# Patient Record
Sex: Male | Born: 1965
Health system: Southern US, Community
[De-identification: ages and names within clinical notes are randomized; demographics above are authoritative.]

## PROBLEM LIST (undated history)

## (undated) ENCOUNTER — Emergency Department (HOSPITAL_COMMUNITY)

## (undated) DIAGNOSIS — I499 Cardiac arrhythmia, unspecified: Secondary | ICD-10-CM

## (undated) DIAGNOSIS — J45909 Unspecified asthma, uncomplicated: Secondary | ICD-10-CM

## (undated) DIAGNOSIS — W3301XA Accidental discharge of shotgun, initial encounter: Secondary | ICD-10-CM

## (undated) DIAGNOSIS — I10 Essential (primary) hypertension: Secondary | ICD-10-CM

## (undated) DIAGNOSIS — E119 Type 2 diabetes mellitus without complications: Secondary | ICD-10-CM

## (undated) DIAGNOSIS — K219 Gastro-esophageal reflux disease without esophagitis: Secondary | ICD-10-CM

## (undated) DIAGNOSIS — I480 Paroxysmal atrial fibrillation: Secondary | ICD-10-CM

## (undated) DIAGNOSIS — E785 Hyperlipidemia, unspecified: Secondary | ICD-10-CM

## (undated) HISTORY — DX: Unspecified asthma, uncomplicated: J45.909

## (undated) HISTORY — DX: Essential (primary) hypertension: I10

## (undated) HISTORY — DX: Accidental discharge of shotgun, initial encounter: W33.01XA

## (undated) HISTORY — DX: Type 2 diabetes mellitus without complications: E11.9

## (undated) HISTORY — PX: APPENDECTOMY: SHX54

## (undated) HISTORY — DX: Cardiac arrhythmia, unspecified: I49.9

## (undated) HISTORY — DX: Gastro-esophageal reflux disease without esophagitis: K21.9

## (undated) HISTORY — DX: Hyperlipidemia, unspecified: E78.5

## (undated) HISTORY — PX: HERNIA REPAIR: SHX51

## (undated) HISTORY — DX: Paroxysmal atrial fibrillation: I48.0

---

## 2011-01-11 ENCOUNTER — Emergency Department (HOSPITAL_BASED_OUTPATIENT_CLINIC_OR_DEPARTMENT_OTHER)
Admission: EM | Admit: 2011-01-11 | Discharge: 2011-01-11 | Disposition: A | Discharge status: Nursing Home (Includes ICF) | Payer: Medicaid Other | Source: Home / Self Care | Attending: Emergency Medicine | Admitting: Emergency Medicine

## 2011-01-11 ENCOUNTER — Emergency Department (INDEPENDENT_AMBULATORY_CARE_PROVIDER_SITE_OTHER): Payer: Medicaid Other

## 2011-01-11 ENCOUNTER — Inpatient Hospital Stay (HOSPITAL_COMMUNITY)
Admission: AD | Admit: 2011-01-11 | Discharge: 2011-01-12 | DRG: 340 | Disposition: A | Discharge status: Home / Self Care | Payer: Medicaid Other | Source: Other Acute Inpatient Hospital | Attending: Surgery | Admitting: Surgery

## 2011-01-11 ENCOUNTER — Other Ambulatory Visit: Payer: Self-pay | Admitting: Surgery

## 2011-01-11 DIAGNOSIS — E785 Hyperlipidemia, unspecified: Secondary | ICD-10-CM | POA: Insufficient documentation

## 2011-01-11 DIAGNOSIS — I10 Essential (primary) hypertension: Secondary | ICD-10-CM | POA: Diagnosis present

## 2011-01-11 DIAGNOSIS — K352 Acute appendicitis with generalized peritonitis, without abscess: Principal | ICD-10-CM | POA: Diagnosis present

## 2011-01-11 DIAGNOSIS — K35209 Acute appendicitis with generalized peritonitis, without abscess, unspecified as to perforation: Principal | ICD-10-CM | POA: Diagnosis present

## 2011-01-11 DIAGNOSIS — R109 Unspecified abdominal pain: Secondary | ICD-10-CM | POA: Insufficient documentation

## 2011-01-11 DIAGNOSIS — F17200 Nicotine dependence, unspecified, uncomplicated: Secondary | ICD-10-CM | POA: Insufficient documentation

## 2011-01-11 DIAGNOSIS — K37 Unspecified appendicitis: Secondary | ICD-10-CM | POA: Insufficient documentation

## 2011-01-11 DIAGNOSIS — Q619 Cystic kidney disease, unspecified: Secondary | ICD-10-CM | POA: Insufficient documentation

## 2011-01-11 LAB — MRSA PCR SCREENING: MRSA by PCR: NEGATIVE

## 2011-01-11 LAB — DIFFERENTIAL
Eosinophils Relative: 1 % (ref 0–5)
Lymphocytes Relative: 29 % (ref 12–46)
Lymphs Abs: 2.8 10*3/uL (ref 0.7–4.0)
Monocytes Absolute: 0.8 10*3/uL (ref 0.1–1.0)

## 2011-01-11 LAB — COMPREHENSIVE METABOLIC PANEL
ALT: 25 U/L (ref 0–53)
AST: 24 U/L (ref 0–37)
Calcium: 9.5 mg/dL (ref 8.4–10.5)
GFR calc Af Amer: >60 mL/min (ref >60)
Sodium: 142 mEq/L (ref 135–145)
Total Protein: 8.3 g/dL (ref 6.0–8.3)

## 2011-01-11 LAB — URINALYSIS, ROUTINE W REFLEX MICROSCOPIC
Bilirubin Urine: NEGATIVE
Glucose, UA: NEGATIVE mg/dL
Specific Gravity, Urine: 1.026 (ref 1.005–1.030)
Urobilinogen, UA: 1 mg/dL (ref 0.0–1.0)

## 2011-01-11 LAB — CBC
HCT: 42.3 % (ref 39.0–52.0)
Hemoglobin: 14.9 g/dL (ref 13.0–17.0)
MCV: 81.3 fL (ref 78.0–100.0)
RDW: 13.1 % (ref 11.5–15.5)
WBC: 9.8 10*3/uL (ref 4.0–10.5)

## 2011-01-11 LAB — URINE MICROSCOPIC-ADD ON

## 2011-01-11 LAB — LIPASE, BLOOD: Lipase: 77 U/L (ref 23–300)

## 2011-01-11 MED ORDER — IOHEXOL 300 MG/ML  SOLN
100.0000 mL | Freq: Once | INTRAMUSCULAR | Status: AC | PRN
  Administered 2011-01-11: 100 mL via INTRAVENOUS

## 2011-01-12 LAB — URINE CULTURE
Colony Count: NO GROWTH
Colony Count: NO GROWTH
Culture  Setup Time: 201203121948
Culture: NO GROWTH

## 2011-01-18 NOTE — Op Note (Signed)
NAMELAWERENCE, Roger Fisher                ACCOUNT NO.:  [id number]  MEDICAL RECORD NO.:  [id number]           PATIENT TYPE:  I  LOCATION:  5121                         FACILITY:  [facility]  PHYSICIAN:  Sandria Bales. Ezzard Standing, M.D.  DATE OF BIRTH:  [birth year]  DATE OF PROCEDURE: 11 January 2011                              OPERATIVE REPORT  PREOPERATIVE DIAGNOSIS:  Appendicitis.  POSTOPERATIVE DIAGNOSIS:  Focal ruptured appendicitis.  PROCEDURE:  Laparoscopic appendectomy.  SURGEON:  Sandria Bales. Ezzard Standing, MD  FIRST ASSISTANT:  Eber Hong, P.A.  ANESTHESIA:  General endotracheal.  ESTIMATED BLOOD LOSS:  Minimal.  INDICATIONS FOR PROCEDURE:  Roger Fisher is a 45 year old black male who sees Dr. Julio Sicks as his primary medical doctor, came through the [facility] ER with abdominal pain, localized to the right lower quadrant.  CT scan suggested appendicitis.  I discussed with the patient indications, potential complications of appendectomy.  Potential complications include, but not limited to, bleeding, infection which I think he already has, the possibility of open surgery and the possibility of another diagnosis.  OPERATIVE NOTE:  The patient placed in a supine position with his left arm tucked to his side, Foley catheter in place.  His abdomen was prepped with ChloraPrep and sterilely draped.  A time-out was held and the surgical checklist run.  He was given 1 g of Cefoxitin initially in procedure.  He had a prior smiling infraumbilical incision.  I used the same incision to get into the abdominal cavity.  I placed a 12 mm Hasson trocar inferior to the umbilicus and secured with a 0 Vicryl suture.  I placed a 5 mm trocar in the right upper quadrant and an 11 mm trocar in the left lower quadrant and then did abdominal exploration.  Left lobe of liver was unremarkable.  Stomach was unremarkable.  The bowel that I could see was unremarkable.    In his right mid abdomen to right lower  quadrant, he had inflammatory mass lateral to the cecum.  The antimesenteric fold of Roger Fisher was taken down of the inflamed appendix.  The appendix itself was curled back on itself, so I mobilized the appendix up.  I took the mesentery of the appendix down to the base of the appendix, used a blue load of the 45 mm Ethicon Endo GI stapler and fired this across the base of the appendix.  The appendix was then placed in EndoCatch bag and delivered through the umbilicus.  The appendiceal stump was then revisualized.  The staple line looked good.  There was no evidence of any leak or bleeding.  I irrigated the abdomen with about 600 cc of saline.  Again, his appendix was focally perforated in this kind of right lower quadrant area, but there was no residual purulence at the end of the case.  I then closed the umbilical port with a 0 Vicryl suture.  The skin at each port was closed with a 5-0 Vicryl suture painted with Dermabond and sterilely dressed.  The patient tolerated the procedure well, was transported to the recovery room in good condition.  Sponge and needle counts  were correct at the end of the case.   Sandria Bales. Ezzard Standing, M.D., FACS   DHN/MEDQ  D:  01/11/2011  T:  01/11/2011  Job:  [id number]  cc:   [name], M.D. Fax: [phone]  Electronically Signed by Ovidio Kin M.D. on 01/18/2011 12:35:35 PM

## 2011-01-22 NOTE — Discharge Summary (Signed)
NAMENICHALAS, COIN                ACCOUNT NO.:  [id number]  MEDICAL RECORD NO.:  [id number]           PATIENT TYPE:  I  LOCATION:  5121                         FACILITY:  [facility]  PHYSICIAN:  Sandria Bales. Ezzard Standing, M.D.  DATE OF BIRTH:  1965/12/13  DATE OF ADMISSION:  01/11/2011 DATE OF DISCHARGE:  01/12/2011                              DISCHARGE SUMMARY   ADMISSION DIAGNOSES: 1. Appendicitis. 2. Hypertension. 3. Dyslipidemia.  DISCHARGE DIAGNOSES: 1. Appendicitis. 2. Hypertension. 3. Dyslipidemia.  PROCEDURES:  Laparoscopic appendectomy January 11, 2011, Dr. Ezzard Standing.  BRIEF HISTORY:  The patient is a 45 year old African American gentleman who developed pain about 2 days ago prior to being seen at [facility] Emergency Department.  Labs and CAT scan at that time showed no acute findings, and he was sent home with instructions to follow up with primary care.  He states the pain went from general abdominal area of the right lower quadrant, and he was seen in the [facility] division on the day of admission.  Repeat labs and CT scan at that time showed a periappendiceal inflammatory changes in the right lower quadrant both consistent with probable appendicitis.  He was subsequently transferred to [facility] for evaluation and treatment.  PAST MEDICAL HISTORY:  Hypertension and dyslipidemia.  PAST SURGICAL HISTORY:  Ventral hernia repair as an infant.  He also had bilateral inguinal hernias most recently 3 years ago in [place].  CURRENT MEDICATIONS:  Pravastatin 20 mg daily and losartan 50 mg daily.  ALLERGIES:  SHELLFISH, otherwise no known drugs or latex allergies.  HOSPITAL COURSE:  The patient was admitted and seen by Dr. Ezzard Standing.  It was his opinion the patient will be best served by going to Surgery. The risks and benefits were discussed.  He was transferred to the OR from the floor.  He was started on IV antibiotics.  In the OR,  he underwent procedures described above.  He tolerated without any problems.  The patient had a focal rupture of his appendix.  It was removed without difficulty, and he was transferred to the floor.  He was mobilized.  On first postoperative morning, he was eating breakfast, tolerating a full diet without any difficulty.  He was ambulating without difficulty.  We plan to keep him through lunchtime.  If he tolerates lunch well, to allow him to be discharged home.    Because of his focally ruptured appendicitis, he was maintained on antibiotics and switched from cefoxitin to Augmentin, and we plan to keep him on Augmentin for a total of 7 days.  The patient was instructed to call us if he has any problems postoperatively that includes fever, general malaise, or recurrent abdominal pain.  He has a full instruction sheet with that.  WOUND CARE:  He has Steri-Strips.  He was instructed to shower.  No bathing.  He can remove the Steri-Strips in 5-7 days.  He will return for followup on January 26, 2011, at 2:15.  He can return to work at [employer] duty on February 01, 2011, and return to full duty on February 05, 2011.  DISCHARGE MEDICATIONS:  He can continue his losartan 50 mg p.o. daily and pravastatin 20 mg daily.  He can continue to take Tylenol 650 mg q.4 p.r.n. for pain or ibuprofen p.r.n. for pain.  He is given a prescription for Augmentin 875/125 one tablet p.o. q.12 for 7 days, and hydrocodone/APAP 1-2 p.o. q.4 h. p.r.n.  His followup appointment is on January 26, 2011, at 2:15 p.m.  CONDITION ON DISCHARGE:  Improved.   Eber Hong, P.A.   Sandria Bales. Ezzard Standing, M.D., FACS   WDJ/MEDQ  D:  01/12/2011  T:  01/13/2011  Job:  [id number]  cc:   [name], M.D.  Electronically Signed by Sherrie George P.A. on 01/19/2011 04:42:41 PM Electronically Signed by Ovidio Kin M.D. on 01/22/2011 07:35:23 AM

## 2011-02-11 NOTE — H&P (Signed)
Roger Fisher, Roger Fisher                ACCOUNT NO.:  [id number]  MEDICAL RECORD NO.:  [id number]           PATIENT TYPE:  I  LOCATION:  5121                         FACILITY:  [facility]  PHYSICIAN:  Sandria Bales. Ezzard Standing, M.D.  DATE OF BIRTH:  February 21, 1966  DATE OF ADMISSION:  01/11/2011                             HISTORY & PHYSICAL   CHIEF COMPLAINT:  Abdominal pain.  HISTORY OF PRESENT ILLNESS:  Roger Fisher is a pleasant 45 year old African American gentleman who complained of abdominal pain about 2 days ago.  This prompted him to go to [facility] Emergency Department where he was seen and evaluated there.  Apparently some labs and CAT scan were done there, essentially showing no acute findings. The patient was sent home from the emergency department with instructions to followup with primary care physician if the pain did not improve.  He states pain subsequently moved from his general abdomen to the right lower quadrant, has started becoming accompanied by nausea and vomiting as well as fever, chills, and sweats.    He saw his primary care provider this morning, who evaluated the patient and subsequently sent him back to the [facility] for followup evaluation.  He was seen there, had a repeat set of labs, and a repeat CT scan was performed.  At this time, there appeared to the periappendiceal inflammatory changes in the right lower quadrant, possibly consistent with appendicitis.  He has subsequently been sent to our facility for definitive treatment.  The patient denies any chest pain, shortness of breath accompanied by this.  He denies any prior abdominal or gastrointestinal history since his Crohn's, ulcer disease, gallbladder disease, or irritable bowel.  He denies any dysuria, hematuria.  He denies any skin rashes.  He denies any ill contacts or recent travel history.  PAST MEDICAL HISTORY:  Significant for hypertension and hyperlipidemia.  PAST  SURGICAL HISTORY:  The patient had a ventral hernia repair as an infant.  He has also had bilateral inguinal hernia repair most recently approximately 3 years ago.  However, he was living in [place] at that time.  FAMILY HISTORY:  Noncontributory to the present case.  SOCIAL HISTORY:  The patient is married.  He is employed in Orthoptist at [employer].  He does smoke approximately pack of cigarettes a day, has an occasional alcoholic beverage and denies any use of illicit drugs.  MEDICATIONS:  Lipitor and a blood pressure medicine he cannot recall  ALLERGIES:  SHELLFISH; otherwise, no known drug or latex allergies.  REVIEW OF SYSTEMS:  Please see history of present illness for pertinent findings.  PHYSICAL EXAMINATION:  GENERAL:  A 45 year old gentleman who does not appear in any acute distress. VITAL SIGNS:  Currently being obtained. ENT:  Unremarkable. NECK:  Supple without lymphadenopathy.  Trachea is midline.  No thyromegaly or masses. LUNGS:  Clear to auscultation.  No wheezes, rhonchi, or rales.  Normal respiratory effort without use of accessory muscles. HEART:  Regular rate and rhythm.  No murmurs, gallops, or rubs. Carotids are 2+ and brisk without bruits.  Peripheral pulses intact and symmetrical.  ABDOMEN:  Soft and nondistended.  No mass effect or hernias are appreciated.  Surgical scars noted, correlate with prior history.  The patient is quite tender in the right lower quadrant with positive guarding. RECTAL:  Deferred. GENITOURINARY:  Deferred. EXTREMITIES:  Good active range of motion in all extremities without crepitus or pain.  Normal muscle strength and tone without atrophy. SKIN:  Otherwise warm and dry with good turgor.  No rashes, lesions, or nodules. NEUROLOGIC:  The patient is alert and oriented x3.  Cranial nerves II- XII grossly intact.  DIAGNOSTICS:  CBC shows a white blood cell count of 9.8, hemoglobin of 14.9, hematocrit of 42.3,  platelet count of 232.  Metabolic panel, sodium of 142, potassium at 3.7, chloride of 104, CO2 of 26, BUN of 8, creatinine of 1.0, glucose of 100.  Liver enzymes including lipase within normal limits.  Urinalysis shows evidence of leukocytosis and bacteriuria.  IMAGING:  CT scan of the abdomen and pelvis shows inflammatory changes in the right lower quadrant with stranding in the periappendiceal fat consistent with early appendicitis.  No free air.  No evidence of abscess formation are noted.  Incidental finding of a left kidney cyst.  IMPRESSION: 1. Acute appendicitis. 2. Hypertension.  PLAN:  We will admit the patient, begin IV antibiotics, and prep for operative resection of the appendix.  I have discussed the procedure of laparoscopic appendectomy including the potential risks, complications, and postoperative expectations with the patient, who agrees to consent.   [name], PA-C   Sandria Bales. Ezzard Standing, M.D., FACS  KB/MEDQ  D:  01/11/2011  T:  01/12/2011  Job:  [id number]  Electronically Signed by [name]  on 01/27/2011 01:52:01 PM Electronically Signed by Ovidio Kin M.D. on 02/11/2011 07:46:12 AM

## 2016-02-11 DIAGNOSIS — N529 Male erectile dysfunction, unspecified: Secondary | ICD-10-CM | POA: Insufficient documentation

## 2016-04-28 DIAGNOSIS — K859 Acute pancreatitis without necrosis or infection, unspecified: Secondary | ICD-10-CM | POA: Insufficient documentation

## 2016-04-28 DIAGNOSIS — F17210 Nicotine dependence, cigarettes, uncomplicated: Secondary | ICD-10-CM | POA: Insufficient documentation

## 2016-06-03 DIAGNOSIS — H40053 Ocular hypertension, bilateral: Secondary | ICD-10-CM | POA: Insufficient documentation

## 2016-06-03 DIAGNOSIS — E1165 Type 2 diabetes mellitus with hyperglycemia: Secondary | ICD-10-CM | POA: Insufficient documentation

## 2016-06-03 DIAGNOSIS — H524 Presbyopia: Secondary | ICD-10-CM | POA: Insufficient documentation

## 2017-01-18 DIAGNOSIS — M6282 Rhabdomyolysis: Secondary | ICD-10-CM | POA: Insufficient documentation

## 2017-02-01 DIAGNOSIS — Z6833 Body mass index (BMI) 33.0-33.9, adult: Secondary | ICD-10-CM | POA: Insufficient documentation

## 2017-05-02 DIAGNOSIS — E1149 Type 2 diabetes mellitus with other diabetic neurological complication: Secondary | ICD-10-CM

## 2017-05-02 DIAGNOSIS — E1140 Type 2 diabetes mellitus with diabetic neuropathy, unspecified: Secondary | ICD-10-CM | POA: Insufficient documentation

## 2018-04-07 ENCOUNTER — Encounter: Payer: Self-pay | Admitting: Family Medicine

## 2018-04-07 ENCOUNTER — Ambulatory Visit: Payer: BLUE CROSS/BLUE SHIELD | Admitting: Family Medicine

## 2018-04-07 VITALS — BP 150/100 | HR 85 | Ht 70.0 in | Wt 213.4 lb

## 2018-04-07 DIAGNOSIS — E119 Type 2 diabetes mellitus without complications: Secondary | ICD-10-CM

## 2018-04-07 DIAGNOSIS — I480 Paroxysmal atrial fibrillation: Secondary | ICD-10-CM | POA: Diagnosis not present

## 2018-04-07 DIAGNOSIS — E1140 Type 2 diabetes mellitus with diabetic neuropathy, unspecified: Secondary | ICD-10-CM | POA: Insufficient documentation

## 2018-04-07 DIAGNOSIS — N529 Male erectile dysfunction, unspecified: Secondary | ICD-10-CM | POA: Diagnosis not present

## 2018-04-07 DIAGNOSIS — I10 Essential (primary) hypertension: Secondary | ICD-10-CM | POA: Diagnosis not present

## 2018-04-07 MED ORDER — CHLORTHALIDONE 25 MG PO TABS: 25.0000 mg | ORAL_TABLET | Freq: Every day | ORAL | 1 refills | Status: DC

## 2018-04-07 MED ORDER — METFORMIN HCL ER 500 MG PO TB24: 1000.0000 mg | ORAL_TABLET | Freq: Every day | ORAL | 1 refills | Status: DC

## 2018-04-07 MED ORDER — METOPROLOL SUCCINATE ER 25 MG PO TB24: 25.0000 mg | ORAL_TABLET | Freq: Every day | ORAL | 1 refills | Status: DC

## 2018-04-07 MED ORDER — SILDENAFIL CITRATE 20 MG PO TABS: ORAL_TABLET | ORAL | 1 refills | Status: DC

## 2018-04-07 MED ORDER — EMPAGLIFLOZIN 25 MG PO TABS: 25.0000 mg | ORAL_TABLET | Freq: Every day | ORAL | 6 refills | Status: DC

## 2018-04-07 MED ORDER — ATORVASTATIN CALCIUM 20 MG PO TABS: 20.0000 mg | ORAL_TABLET | Freq: Every day | ORAL | 1 refills | Status: DC

## 2018-04-07 NOTE — Patient Instructions (Signed)
Restart medications listed on current med list Follow up with me in 6 weeks.    Atrial Fibrillation Atrial fibrillation is a type of heartbeat that is irregular or fast (rapid). If you have this condition, your heart keeps quivering in a weird (chaotic) way. This condition can make it so your heart cannot pump blood normally. Having this condition gives a person more risk for stroke, heart failure, and other heart problems. There are different types of atrial fibrillation. Talk with your doctor to learn about the type that you have. Follow these instructions at home:  Take over-the-counter and prescription medicines only as told by your doctor.  If your doctor prescribed a blood-thinning medicine, take it exactly as told. Taking too much of it can cause bleeding. If you do not take enough of it, you will not have the protection that you need against stroke and other problems.  Do not use any tobacco products. These include cigarettes, chewing tobacco, and e-cigarettes. If you need help quitting, ask your doctor.  If you have apnea (obstructive sleep apnea), manage it as told by your doctor.  Do not drink alcohol.  Do not drink beverages that have caffeine. These include coffee, soda, and tea.  Maintain a healthy weight. Do not use diet pills unless your doctor says they are safe for you. Diet pills may make heart problems worse.  Follow diet instructions as told by your doctor.  Exercise regularly as told by your doctor.  Keep all follow-up visits as told by your doctor. This is important. Contact a doctor if:  You notice a change in the speed, rhythm, or strength of your heartbeat.  You are taking a blood-thinning medicine and you notice more bruising.  You get tired more easily when you move or exercise. Get help right away if:  You have pain in your chest or your belly (abdomen).  You have sweating or weakness.  You feel sick to your stomach (nauseous).  You notice blood  in your throw up (vomit), poop (stool), or pee (urine).  You are short of breath.  You suddenly have swollen feet and ankles.  You feel dizzy.  Your suddenly get weak or numb in your face, arms, or legs, especially if it happens on one side of your body.  You have trouble talking, trouble understanding, or both.  Your face or your eyelid droops on one side. These symptoms may be an emergency. Do not wait to see if the symptoms will go away. Get medical help right away. Call your local emergency services (911 in the [place] Do not drive yourself to the hospital. This information is not intended to replace advice given to you by your health care provider. Make sure you discuss any questions you have with your health care provider. Document Released: 07/27/2008 Document Revised: 03/25/2016 Document Reviewed: 02/12/2015 Elsevier Interactive Patient Education  [employer]2018 Elsevier Inc.

## 2018-04-07 NOTE — Assessment & Plan Note (Signed)
Poorly controlled Restart metformin and jardiance Monitor glucose at home Update a1c and microalbumin today.

## 2018-04-07 NOTE — Addendum Note (Signed)
Addended by: Arva ChafeGARCIA, Gawain Crombie M on: 04/07/2018 04:07 PM   Modules accepted: Orders

## 2018-04-07 NOTE — Assessment & Plan Note (Signed)
He has done well with sildenafil previously, renewed.

## 2018-04-07 NOTE — Progress Notes (Signed)
Roger LarsenMark Fisher - 52 y.o. male MRN [id number]  Date of birth: 09/25/1966  Subjective Chief Complaint  Patient presents with  . Atrial Fibrillation  . Diabetes  . Hypertension    HPI Roger LarsenMark Fisher is a 52 y.o.  male known to me from previous practice here today to establish care and follow up of chronic medical conditions.  He was seeing another provider after I left previous practice however did not agree with treatment and quit going.  Hasn't been seen in >1 year.  Diagnosed with A. Fib last year and saw Cardiology x1. He is not currently taking any medications.    -HTN:  Prior treatment with chlorthalidone and metoprolol added after diagnosis of a. Fib.  Not currently taking either one.  BP elevated today, does not check BP at home.  Has been more active due to job and has lost some weight.  Denies high salt diet.   -A. Fib:  Diagnosed with a. Fib last year, started on amiodarone and toprol.  He was anticoagulated with eliquis for CHA2DS2-VASc score of 2.  Denies side effects from medications.  Seen by cardiology x1, unclear if any further work up was completed.  He denies any symptoms at this time but felt like his heart was out of rhythm the other night, which lasted for several minutes.  He denies anginal symptoms, sob, dizziness, increased fatigue or edema.  -T2DM:  History of poorly controlled diabetes.  Prior tx with jardiance and metformin.  Metformin had been increased to 1000mg  bid however he did not tolerate this well.  He has not monitored blood sugars at home.  He does report polyuria but denies polydipsia.  Reports good sensation in extremities without neuropathic symptoms.    -HLD:  Has done well with atorvastatin previously. Denies myalgias with this.   ROS:  ROS completed and negative except as noted per HPI No Known Allergies  Past Medical History:  Diagnosis Date  . Arrhythmia   . Diabetes mellitus without complication (HCC)    type 2  . Hypertension     Past Surgical  History:  Procedure Laterality Date  . APPENDECTOMY    . HERNIA REPAIR      Social History   Socioeconomic History  . Marital status: Married    Spouse name: Not on file  . Number of children: Not on file  . Years of education: Not on file  . Highest education level: Not on file  Occupational History  . Not on file  Social Needs  . Financial resource strain: Not on file  . Food insecurity:    Worry: Not on file    Inability: Not on file  . Transportation needs:    Medical: Not on file    Non-medical: Not on file  Tobacco Use  . Smoking status: Former Games developermoker  . Smokeless tobacco: Never Used  Substance and Sexual Activity  . Alcohol use: Not Currently    Frequency: Never  . Drug use: Never  . Sexual activity: Not on file  Lifestyle  . Physical activity:    Days per week: Not on file    Minutes per session: Not on file  . Stress: Not on file  Relationships  . Social connections:    Talks on phone: Not on file    Gets together: Not on file    Attends religious service: Not on file    Active member of club or organization: Not on file    Attends meetings of clubs  or organizations: Not on file    Relationship status: Not on file  Other Topics Concern  . Not on file  Social History Narrative  . Not on file    History reviewed. No pertinent family history.  Health Maintenance  Topic Date Due  . HEMOGLOBIN A1C  Apr 01, 1967  . PNEUMOCOCCAL POLYSACCHARIDE VACCINE (1) 02/06/1969  . FOOT EXAM  02/06/1977  . OPHTHALMOLOGY EXAM  02/06/1977  . HIV Screening  02/06/1982  . TETANUS/TDAP  02/06/1986  . COLONOSCOPY  02/06/2017  . INFLUENZA VACCINE  06/01/2018    ----------------------------------------------------------------------------------------------------------------------------------------------------------------------------------------------------------------- Physical Exam BP (!) 150/100   Pulse 85   Ht 5\' 10"  (1.778 m)   Wt 213 lb 6.4 oz (96.8 kg)   BMI  30.62 kg/m   Physical Exam  Constitutional: He is oriented to person, place, and time. He appears well-nourished. No distress.  HENT:  Head: Normocephalic and atraumatic.  Mouth/Throat: Oropharynx is clear and moist.  Eyes: No scleral icterus.  Neck: Neck supple. No thyromegaly present.  Cardiovascular: Normal rate, regular rhythm and normal heart sounds.  No murmur heard. Pulses:      Dorsalis pedis pulses are 2+ on the right side, and 2+ on the left side.       Posterior tibial pulses are 2+ on the right side, and 2+ on the left side.  Pulmonary/Chest: Effort normal and breath sounds normal.  Musculoskeletal: He exhibits no edema.       Right foot: There is normal range of motion and no deformity.       Left foot: There is normal range of motion and no deformity.  Feet:  Right Foot:  Protective Sensation: 4 sites tested. 4 sites sensed.  Skin Integrity: Negative for ulcer, blister, skin breakdown, erythema, warmth, callus or dry skin.  Left Foot:  Protective Sensation: 4 sites tested. 4 sites sensed.  Skin Integrity: Negative for ulcer, blister, skin breakdown, erythema, warmth, callus or dry skin.  Neurological: He is alert and oriented to person, place, and time.  Skin: Skin is warm and dry.  Psychiatric: He has a normal mood and affect. His behavior is normal.     EKG: NSR without ST-T wave changes.  Normal PR and QT intervals.  ------------------------------------------------------------------------------------------------------------------------------------------------------------------------------------------------------------------- Assessment and Plan  Essential hypertension BP elevated Restart chlorthalidone and toprol Instructed to follow low salt diet.  Update labs  Paroxysmal atrial fibrillation (HCC) RRR on exam and NSR on EKG today Restart toprol Check renal function and CBC and plan to restart eliquis if appropriate given elevated CHA2DS2-VASc  score Referral to cardiology.   Type 2 diabetes mellitus without complication, without long-term current use of insulin (HCC) Poorly controlled Restart metformin and jardiance Monitor glucose at home Update a1c and microalbumin today.    Erectile dysfunction He has done well with sildenafil previously, renewed.

## 2018-04-07 NOTE — Assessment & Plan Note (Signed)
BP elevated Restart chlorthalidone and toprol Instructed to follow low salt diet.  Update labs

## 2018-04-07 NOTE — Assessment & Plan Note (Signed)
RRR on exam and NSR on EKG today Restart toprol Check renal function and CBC and plan to restart eliquis if appropriate given elevated CHA2DS2-VASc score Referral to cardiology.

## 2018-04-10 ENCOUNTER — Telehealth: Payer: Self-pay | Admitting: Family Medicine

## 2018-04-10 MED ORDER — SILDENAFIL CITRATE 100 MG PO TABS: 50.0000 mg | ORAL_TABLET | Freq: Every day | ORAL | 11 refills | Status: DC | PRN

## 2018-04-10 NOTE — Telephone Encounter (Signed)
Hey can you look at this and help?

## 2018-04-10 NOTE — Telephone Encounter (Signed)
 20mg  sildenafil is likely not covered by insurance- will switch to 100mg  sildenafil as this is generic now Was waiting on labs to return prior to sending in eliquis to be sure it was safe for him to restart.

## 2018-04-10 NOTE — Telephone Encounter (Signed)
Copied from CRM [id number]. Topic: Quick Communication - Rx Refill/Question >> Apr 10, 2018 11:49 AM Maia Pettiesrtiz, Kristie S wrote: Medication: Everlene BallsELIQUIS - pt states this medication was not sent to the pharmacy 04/07/18 - he said that he was hoping to get a coupon as well so he can afford the blood thinner - if no way to get it cheaper he will need something else. sildenafil - pt states pharmacy told him he cannot get it - maybe it requires authorization - pt requesting call back Has the patient contacted their pharmacy? Yes - they do not have eliquis - he isn't sure why insurance "rejected" the viagra Preferred Pharmacy (with phone number or street name): [id number] - [place], [place] - [id number] [phone] (Phone) [phone] (Fax)

## 2018-04-10 NOTE — Telephone Encounter (Signed)
Spoke with pt and informed him of rx change. Also informed him that his Eloquis would be evaluated after he had labs drawn. Pt states hes going to lab on Wed at 3pm. TLG

## 2018-04-12 ENCOUNTER — Other Ambulatory Visit (INDEPENDENT_AMBULATORY_CARE_PROVIDER_SITE_OTHER): Payer: BLUE CROSS/BLUE SHIELD

## 2018-04-12 DIAGNOSIS — I480 Paroxysmal atrial fibrillation: Secondary | ICD-10-CM

## 2018-04-12 DIAGNOSIS — I10 Essential (primary) hypertension: Secondary | ICD-10-CM

## 2018-04-12 DIAGNOSIS — E119 Type 2 diabetes mellitus without complications: Secondary | ICD-10-CM | POA: Diagnosis not present

## 2018-04-12 LAB — COMPREHENSIVE METABOLIC PANEL
ALT: 16 U/L (ref 0–53)
AST: 15 U/L (ref 0–37)
Albumin: 4.4 g/dL (ref 3.5–5.2)
Alkaline Phosphatase: 109 U/L (ref 39–117)
BILIRUBIN TOTAL: 0.5 mg/dL (ref 0.2–1.2)
BUN: 16 mg/dL (ref 6–23)
CALCIUM: 10.2 mg/dL (ref 8.4–10.5)
CHLORIDE: 92 meq/L — AB (ref 96–112)
CO2: 33 meq/L — AB (ref 19–32)
Creatinine, Ser: 0.92 mg/dL (ref 0.40–1.50)
GFR: 111.47 mL/min (ref >60.00)
GLUCOSE: 372 mg/dL — AB (ref 70–99)
Potassium: 3.1 mEq/L — ABNORMAL LOW (ref 3.5–5.1)
Sodium: 138 mEq/L (ref 135–145)
Total Protein: 7.2 g/dL (ref 6.0–8.3)

## 2018-04-12 LAB — CBC
HCT: 43 % (ref 39.0–52.0)
HEMOGLOBIN: 14.8 g/dL (ref 13.0–17.0)
MCHC: 34.5 g/dL (ref 30.0–36.0)
MCV: 84.8 fl (ref 78.0–100.0)
PLATELETS: 283 10*3/uL (ref 150.0–400.0)
RBC: 5.07 Mil/uL (ref 4.22–5.81)
RDW: 13.1 % (ref 11.5–15.5)
WBC: 6.5 10*3/uL (ref 4.0–10.5)

## 2018-04-12 LAB — TSH: TSH: 0.55 u[IU]/mL (ref 0.35–4.50)

## 2018-04-12 LAB — LDL CHOLESTEROL, DIRECT: Direct LDL: 105 mg/dL

## 2018-04-12 LAB — LIPID PANEL
CHOL/HDL RATIO: 6
CHOLESTEROL: 153 mg/dL (ref 0–200)
HDL: 25.7 mg/dL — ABNORMAL LOW (ref >39.00)
NONHDL: 127.54
TRIGLYCERIDES: 207 mg/dL — AB (ref 0.0–149.0)
VLDL: 41.4 mg/dL — AB (ref 0.0–40.0)

## 2018-04-12 LAB — MICROALBUMIN / CREATININE URINE RATIO
Creatinine,U: 97.9 mg/dL
Microalb Creat Ratio: 3.9 mg/g (ref 0.0–30.0)
Microalb, Ur: 3.8 mg/dL — ABNORMAL HIGH (ref 0.0–1.9)

## 2018-04-12 LAB — HEMOGLOBIN A1C: Hgb A1c MFr Bld: 13.5 % — ABNORMAL HIGH (ref 4.6–6.5)

## 2018-04-13 MED ORDER — POTASSIUM CHLORIDE CRYS ER 20 MEQ PO TBCR: 20.0000 meq | EXTENDED_RELEASE_TABLET | Freq: Every day | ORAL | 3 refills | Status: DC

## 2018-04-13 MED ORDER — APIXABAN 5 MG PO TABS: 5.0000 mg | ORAL_TABLET | Freq: Two times a day (BID) | ORAL | 5 refills | Status: DC

## 2018-04-13 NOTE — Progress Notes (Signed)
-  A1c is very high.  Take current metformin and jardiance.  Follow low carb diet with regular exercise.  If not seeing significant improvement at next check, will need to start insulin.  -Potassium is low, I am going to send in a prescription for this.  -Will send in rx for eliquis, we have coupon here for him.

## 2018-04-13 NOTE — Addendum Note (Signed)
Addended by: Mammie LorenzoMATTHEWS, Yuan Gann E on: 04/13/2018 11:37 AM   Modules accepted: Orders

## 2018-04-18 ENCOUNTER — Telehealth: Payer: Self-pay | Admitting: Family Medicine

## 2018-04-18 NOTE — Telephone Encounter (Unsigned)
Copied from CRM [phone]. Topic: General - Other >> Apr 18, 2018  3:10 PM Mcneil, Ja-Kwan wrote: Reason for CRM: Pt states the side effects of the medications that he recently began to take may be causing a lump in his shoulder and he would like to speak with someone to discuss. Pt request a call back. Cb# [phone]

## 2018-04-19 ENCOUNTER — Ambulatory Visit: Payer: BLUE CROSS/BLUE SHIELD | Admitting: Cardiology

## 2018-04-19 ENCOUNTER — Encounter: Payer: Self-pay | Admitting: Cardiology

## 2018-04-19 VITALS — BP 118/62 | HR 92 | Ht 70.0 in | Wt 206.1 lb

## 2018-04-19 DIAGNOSIS — R0789 Other chest pain: Secondary | ICD-10-CM | POA: Diagnosis not present

## 2018-04-19 DIAGNOSIS — E119 Type 2 diabetes mellitus without complications: Secondary | ICD-10-CM | POA: Diagnosis not present

## 2018-04-19 DIAGNOSIS — I10 Essential (primary) hypertension: Secondary | ICD-10-CM | POA: Diagnosis not present

## 2018-04-19 DIAGNOSIS — I480 Paroxysmal atrial fibrillation: Secondary | ICD-10-CM | POA: Diagnosis not present

## 2018-04-19 DIAGNOSIS — N529 Male erectile dysfunction, unspecified: Secondary | ICD-10-CM

## 2018-04-19 MED ORDER — LISINOPRIL 5 MG PO TABS: 5.0000 mg | ORAL_TABLET | Freq: Every day | ORAL | 6 refills | Status: DC

## 2018-04-19 NOTE — Patient Instructions (Signed)
Medication Instructions:  Your physician has recommended you make the following change in your medication:  STOP chlorthalidone STOP potassium  START lisinopril 5 mg daily   Labwork: Your physician recommends that you have the following labs drawn in 1 week: BMP. Please come back to our office on Thursday, 04/27/18, no appointment needed.   Testing/Procedures: You had an EKG today.   Your physician has requested that you have a stress echocardiogram. For further information please visit [email or web address]. Please follow instruction sheet as given.   Your physician has recommended that you wear a holter monitor. Holter monitors are medical devices that record the heart's electrical activity. Doctors most often use these monitors to diagnose arrhythmias. Arrhythmias are problems with the speed or rhythm of the heartbeat. The monitor is a small, portable device. You can wear one while you do your normal daily activities. This is usually used to diagnose what is causing palpitations/syncope (passing out). Wear for 7 days.   Follow-Up: Your physician recommends that you schedule a follow-up appointment in: 1 month.   If you need a refill on your cardiac medications before your next appointment, please call your pharmacy.   Thank you for choosing [facility]! Mady Gemmaatherine Burnett Lieber, RN [phone]

## 2018-04-19 NOTE — Telephone Encounter (Signed)
Please have him come in to see me for this.  Thanks!

## 2018-04-19 NOTE — Telephone Encounter (Signed)
Pt. Reports his left shoulder started hurting Sunday. He was just standing and had a sharp pain in his shoulder where his neck and shoulder meet. Sometimes there is "a lump there and then it goes away. Hurts to lift my shoulder over my head." Pt. Thinks it is due to his medications he has restarted. Wants to know Dr. Ashley RoyaltyMatthews thinks. Please advise pt.

## 2018-04-19 NOTE — Addendum Note (Signed)
Addended by: Crist FatLOCKHART, Jacere Pangborn P on: 04/19/2018 04:20 PM   Modules accepted: Orders

## 2018-04-19 NOTE — Progress Notes (Signed)
Cardiology Consultation:    Date:  04/19/2018   ID:  Roger Fisher, DOB 1966/07/21, MRN [id number]  PCP:  Everrett Coombe, DO  Cardiologist:  Gypsy Balsam, MD   Referring MD: Everrett Coombe, DO   Chief Complaint  Patient presents with  . Atrial Fibrillation  I have palpitations  History of Present Illness:    Roger Fisher is a 52 y.o. male who is being seen today for the evaluation of a fibrillation at the request of Everrett Coombe, DO.  In March he ended up going to the hospital.  He was find to be in atrial fibrillation.  From what I can gather he was evaluated with echocardiogram apparently there is some notion of some asymmetry sitting in his septum.  He converted spontaneously articulation has been initiated then he seen his primary care physician after that.  He decided to stop all his medication he did not take any medications until he find some new physician who put him on excellent medical management.  He described to have still some palpitations lasting usually for a few seconds couple times a day.  He does have exertional shortness of breath.  Described to have also some pain in the left side of her chest not related to exertion.  Described to have some dizziness especially when he gets up very quickly.  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  No typical tightness squeezing pressure burning chest.  Past Medical History:  Diagnosis Date  . Arrhythmia   . Asthma   . Diabetes mellitus without complication (HCC)    type 2  . GERD (gastroesophageal reflux disease)   . Hyperlipidemia   . Hypertension   . PAF (paroxysmal atrial fibrillation) (HCC)     Past Surgical History:  Procedure Laterality Date  . APPENDECTOMY    . HERNIA REPAIR      Current Medications: Current Meds  Medication Sig  . apixaban (ELIQUIS) 5 MG TABS tablet Take 1 tablet (5 mg total) by mouth 2 (two) times daily.  Marland Kitchen atorvastatin (LIPITOR) 20 MG tablet Take 1 tablet (20 mg total) by  mouth daily.  . chlorthalidone (HYGROTON) 25 MG tablet Take 1 tablet (25 mg total) by mouth daily.  . empagliflozin (JARDIANCE) 25 MG TABS tablet Take 25 mg by mouth daily.  . metFORMIN (GLUCOPHAGE-XR) 500 MG 24 hr tablet Take 2 tablets (1,000 mg total) by mouth daily with breakfast.  . metoprolol succinate (TOPROL-XL) 25 MG 24 hr tablet Take 1 tablet (25 mg total) by mouth daily.  . potassium chloride SA (K-DUR,KLOR-CON) 20 MEQ tablet Take 1 tablet (20 mEq total) by mouth daily.  . sildenafil (VIAGRA) 100 MG tablet Take 0.5-1 tablets (50-100 mg total) by mouth daily as needed for erectile dysfunction.     Allergies:   Patient has no known allergies.   Social History   Socioeconomic History  . Marital status: Married    Spouse name: Not on file  . Number of children: Not on file  . Years of education: Not on file  . Highest education level: Not on file  Occupational History  . Not on file  Social Needs  . Financial resource strain: Not on file  . Food insecurity:    Worry: Not on file    Inability: Not on file  . Transportation needs:    Medical: Not on file    Non-medical: Not on file  Tobacco Use  . Smoking status: Former Games developer  . Smokeless  tobacco: Never Used  Substance and Sexual Activity  . Alcohol use: Not Currently    Frequency: Never  . Drug use: Never  . Sexual activity: Not on file  Lifestyle  . Physical activity:    Days per week: Not on file    Minutes per session: Not on file  . Stress: Not on file  Relationships  . Social connections:    Talks on phone: Not on file    Gets together: Not on file    Attends religious service: Not on file    Active member of club or organization: Not on file    Attends meetings of clubs or organizations: Not on file    Relationship status: Not on file  Other Topics Concern  . Not on file  Social History Narrative  . Not on file     Family History: The patient's family history includes Alcohol abuse in his father;  Cancer in his mother; Early death in his sister. ROS:   Please see the history of present illness.    All 14 point review of systems negative except as described per history of present illness.  EKGs/Labs/Other Studies Reviewed:    The following studies were reviewed today: Echocardiogram done in the event showed preserved left ventricular ejection fraction, moderate left ventricular hypertrophy, both atria were normal in size.  There is some asymmetrical septal hypertrophy with septum measuring 1.5 cm and posterior wall measuring 1.1.  There is no notion about the left ventricle after gradient.  EKG:  EKG is  ordered today.  The ekg ordered today demonstrates normal sinus rhythm normal P interval normal QS click complex duration morphology nonspecific ST-T segment changes  Recent Labs: 04/12/2018: ALT 16; BUN 16; Creatinine, Ser 0.92; Hemoglobin 14.8; Platelets 283.0; Potassium 3.1; Sodium 138; TSH 0.55  Recent Lipid Panel    Component Value Date/Time   CHOL 153 04/12/2018 1337   TRIG 207.0 (H) 04/12/2018 1337   HDL 25.70 (L) 04/12/2018 1337   CHOLHDL 6 04/12/2018 1337   VLDL 41.4 (H) 04/12/2018 1337   LDLDIRECT 105.0 04/12/2018 1337    Physical Exam:    VS:  BP 118/62   Pulse 92   Ht 5\' 10"  (1.778 m)   Wt 206 lb 1.9 oz (93.5 kg)   SpO2 96%   BMI 29.58 kg/m     Wt Readings from Last 3 Encounters:  04/19/18 206 lb 1.9 oz (93.5 kg)  04/07/18 213 lb 6.4 oz (96.8 kg)     GEN:  Well nourished, well developed in no acute distress HEENT: Normal NECK: No JVD; No carotid bruits LYMPHATICS: No lymphadenopathy CARDIAC: RRR, no murmurs, no rubs, no gallops RESPIRATORY:  Clear to auscultation without rales, wheezing or rhonchi  ABDOMEN: Soft, non-tender, non-distended MUSCULOSKELETAL:  No edema; No deformity  SKIN: Warm and dry NEUROLOGIC:  Alert and oriented x 3 PSYCHIATRIC:  Normal affect   ASSESSMENT:    1. Paroxysmal atrial fibrillation (HCC)   2. Type 2 diabetes  mellitus without complication, without long-term current use of insulin (HCC)   3. Essential hypertension   4. Vasculogenic erectile dysfunction, unspecified vasculogenic erectile dysfunction type   5. Atypical chest pain    PLAN:    In order of problems listed above:  1. Paroxysmal atrial fibrillation.  His chads 2 Vascor equals 2.  He is unclear anticoagulated which is very appropriate.  We will continue with spent a great deal of time talking about the reasons for it and he agreed  to continue.  I will ask him to wear long-term Holter monitor return to see how much atrial fibrillation if at all he still has.  Based on that we will decide if he needs any antiarrhythmic therapy. 2. Multiple risk factors for coronary artery disease including some atypical symptoms I think it would be appropriate to proceed with stress testing stress echocardiogram should be sufficient.  In the meantime we will continue present management. 3. Essential hypertension his blood pressure was reasonably controlled today we will continue present management. 4. Type 2 diabetes: He is on excellent medical therapy including Jardiance the only thing missing here is ACE inhibitor.  I will ask him to stop chlorthalidone, we will stop his potassium and I will put him on 5 mg lisinopril.  Chem-7 will be checked within a week.  Overall gentleman with paroxysmal atrial fibrillation now on excellent medical therapy for his problems.  Will try to identify how frequent atrial fibrillation is trying to decide about potential antiarrhythmic.  See him back in my office in about 3 to 4 weeks   Medication Adjustments/Labs and Tests Ordered: Current medicines are reviewed at length with the patient today.  Concerns regarding medicines are outlined above.  No orders of the defined types were placed in this encounter.  No orders of the defined types were placed in this encounter.   Signed, Georgeanna Leaobert J. Krasowski, MD, [facility]. 04/19/2018 4:00  PM    [facility]

## 2018-04-19 NOTE — Telephone Encounter (Signed)
Spoke with wife and told him that Dr Ashley RoyaltyMatthews would like him to make an appointment.

## 2018-04-19 NOTE — Telephone Encounter (Signed)
Should I set him up with an appointment?

## 2018-04-20 ENCOUNTER — Ambulatory Visit: Payer: BLUE CROSS/BLUE SHIELD | Admitting: Family Medicine

## 2018-04-20 ENCOUNTER — Encounter: Payer: Self-pay | Admitting: Family Medicine

## 2018-04-20 VITALS — BP 108/70 | HR 102 | Temp 98.5°F | Ht 70.0 in | Wt 208.0 lb

## 2018-04-20 DIAGNOSIS — Z0100 Encounter for examination of eyes and vision without abnormal findings: Secondary | ICD-10-CM

## 2018-04-20 DIAGNOSIS — M25512 Pain in left shoulder: Secondary | ICD-10-CM

## 2018-04-20 DIAGNOSIS — E119 Type 2 diabetes mellitus without complications: Secondary | ICD-10-CM

## 2018-04-20 DIAGNOSIS — M7989 Other specified soft tissue disorders: Secondary | ICD-10-CM | POA: Insufficient documentation

## 2018-04-20 MED ORDER — DICLOFENAC SODIUM 1 % TD GEL: 4.0000 g | Freq: Four times a day (QID) | TRANSDERMAL | 0 refills | Status: DC

## 2018-04-20 NOTE — Patient Instructions (Signed)
Try topical diclofenac to shoulder. I have placed a referral for you to see Dr. Jordan LikesSchmitz as well

## 2018-04-20 NOTE — Progress Notes (Signed)
Roger Fisher - 52 y.o. male MRN [id number]  Date of birth: 24-Mar-1966  Subjective Chief Complaint  Patient presents with  . Mass    lump on L shoulder/painful/sore/4 days    HPI Roger Fisher is a 52 y.o. male here today with complain of pain in the L shoulder.  Noticed painful "lump" in L shoulder about 4 days ago.  Area was very large initially however has improved some over the past few days.  It is however very painful.  He has pain with movement of the shoulder and arm.  He denies any numbness, tingling or significant weakness.  He has never noticed this area before.    He also requests referral to ophthalmology for updated diabetic eye exam.    ROS: ROS completed and negative except as noted per HPI No Known Allergies  Past Medical History:  Diagnosis Date  . Arrhythmia   . Asthma   . Diabetes mellitus without complication (HCC)    type 2  . GERD (gastroesophageal reflux disease)   . Hyperlipidemia   . Hypertension   . PAF (paroxysmal atrial fibrillation) (HCC)     Past Surgical History:  Procedure Laterality Date  . APPENDECTOMY    . HERNIA REPAIR      Social History   Socioeconomic History  . Marital status: Married    Spouse name: Not on file  . Number of children: Not on file  . Years of education: Not on file  . Highest education level: Not on file  Occupational History  . Not on file  Social Needs  . Financial resource strain: Not on file  . Food insecurity:    Worry: Not on file    Inability: Not on file  . Transportation needs:    Medical: Not on file    Non-medical: Not on file  Tobacco Use  . Smoking status: Former Games developer  . Smokeless tobacco: Never Used  Substance and Sexual Activity  . Alcohol use: Not Currently    Frequency: Never  . Drug use: Never  . Sexual activity: Not on file  Lifestyle  . Physical activity:    Days per week: Not on file    Minutes per session: Not on file  . Stress: Not on file  Relationships  . Social  connections:    Talks on phone: Not on file    Gets together: Not on file    Attends religious service: Not on file    Active member of club or organization: Not on file    Attends meetings of clubs or organizations: Not on file    Relationship status: Not on file  Other Topics Concern  . Not on file  Social History Narrative  . Not on file    Family History  Problem Relation Age of Onset  . Cancer Mother   . Alcohol abuse Father   . Early death Sister     Health Maintenance  Topic Date Due  . PNEUMOCOCCAL POLYSACCHARIDE VACCINE (1) 02/06/1969  . FOOT EXAM  02/06/1977  . OPHTHALMOLOGY EXAM  02/06/1977  . HIV Screening  02/06/1982  . TETANUS/TDAP  02/06/1986  . COLONOSCOPY  02/06/2017  . INFLUENZA VACCINE  06/01/2018  . HEMOGLOBIN A1C  10/12/2018    ----------------------------------------------------------------------------------------------------------------------------------------------------------------------------------------------------------------- Physical Exam BP 108/70   Pulse (!) 102   Temp 98.5 F (36.9 C) (Oral)   Ht 5\' 10"  (1.778 m)   Wt 208 lb (94.3 kg)   SpO2 97%   BMI 29.84 kg/m  Physical Exam  Constitutional: He is oriented to person, place, and time. He appears well-nourished. No distress.  HENT:  Head: Normocephalic and atraumatic.  Cardiovascular: Normal rate, regular rhythm and normal heart sounds.  Pulmonary/Chest: Effort normal and breath sounds normal.  Musculoskeletal:  Tender nodule, anterior L shoulder.  ROM is normal.  Pain with yergason/speed test, no ttp along bicipital groove.   Neurological: He is alert and oriented to person, place, and time.  Skin: Skin is warm and dry.  Psychiatric: He has a normal mood and affect. His behavior is normal.     ------------------------------------------------------------------------------------------------------------------------------------------------------------------------------------------------------------------- Assessment and Plan  Acute pain of left shoulder TTP over coracoid area, ?short head biceps tear.  Referral placed to sports med.  Topical voltaren prescribed, will avoid oral nsaids given xarelto use.    Referral placed to ophthalmology for diabetic eye exam.

## 2018-04-20 NOTE — Assessment & Plan Note (Signed)
TTP over coracoid area, ?short head biceps tear.  Referral placed to sports med.  Topical voltaren prescribed, will avoid oral nsaids given xarelto use.

## 2018-04-24 ENCOUNTER — Ambulatory Visit: Payer: BLUE CROSS/BLUE SHIELD | Admitting: Family Medicine

## 2018-04-24 NOTE — Progress Notes (Deleted)
  Roger LarsenMark Fisher - 52 y.o. male MRN [id number]  Date of birth: 07/04/1966  SUBJECTIVE:  Including CC & ROS.  No chief complaint on file.   Roger LarsenMark Fisher is a 52 y.o. male that is  ***.  ***   Review of Systems  HISTORY: Past Medical, Surgical, Social, and Family History Reviewed & Updated per EMR.   Pertinent Historical Findings include:  Past Medical History:  Diagnosis Date  . Arrhythmia   . Asthma   . Diabetes mellitus without complication (HCC)    type 2  . GERD (gastroesophageal reflux disease)   . Hyperlipidemia   . Hypertension   . PAF (paroxysmal atrial fibrillation) (HCC)     Past Surgical History:  Procedure Laterality Date  . APPENDECTOMY    . HERNIA REPAIR      No Known Allergies  Family History  Problem Relation Age of Onset  . Cancer Mother   . Alcohol abuse Father   . Early death Sister      Social History   Socioeconomic History  . Marital status: Married    Spouse name: Not on file  . Number of children: Not on file  . Years of education: Not on file  . Highest education level: Not on file  Occupational History  . Not on file  Social Needs  . Financial resource strain: Not on file  . Food insecurity:    Worry: Not on file    Inability: Not on file  . Transportation needs:    Medical: Not on file    Non-medical: Not on file  Tobacco Use  . Smoking status: Former Games developermoker  . Smokeless tobacco: Never Used  Substance and Sexual Activity  . Alcohol use: Not Currently    Frequency: Never  . Drug use: Never  . Sexual activity: Not on file  Lifestyle  . Physical activity:    Days per week: Not on file    Minutes per session: Not on file  . Stress: Not on file  Relationships  . Social connections:    Talks on phone: Not on file    Gets together: Not on file    Attends religious service: Not on file    Active member of club or organization: Not on file    Attends meetings of clubs or organizations: Not on file    Relationship status:  Not on file  . Intimate partner violence:    Fear of current or ex partner: Not on file    Emotionally abused: Not on file    Physically abused: Not on file    Forced sexual activity: Not on file  Other Topics Concern  . Not on file  Social History Narrative  . Not on file     PHYSICAL EXAM:  VS: There were no vitals taken for this visit. Physical Exam Gen: NAD, alert, cooperative with exam, well-appearing ENT: normal lips, normal nasal mucosa,  Eye: normal EOM, normal conjunctiva and lids CV:  no edema, +2 pedal pulses   Resp: no accessory muscle use, non-labored,  GI: no masses or tenderness, no hernia  Skin: no rashes, no areas of induration  Neuro: normal tone, normal sensation to touch Psych:  normal insight, alert and oriented MSK:  ***      ASSESSMENT & PLAN:   No problem-specific Assessment & Plan notes found for this encounter.

## 2018-04-25 ENCOUNTER — Telehealth: Payer: Self-pay | Admitting: Family Medicine

## 2018-04-25 NOTE — Telephone Encounter (Signed)
Received PA denied for Diclofenac 1% gel for Mr. Su Hiltoberts. Pt is aware, he stated he doesn't need the medicine at the moment because his shoulder is getting better. Advise the pt to let us know if he need this med or symptoms worse. --This can wait unitl Dr. Molli HazardMatthew comes back.

## 2018-05-01 ENCOUNTER — Ambulatory Visit: Payer: Self-pay | Admitting: Family Medicine

## 2018-05-01 NOTE — Progress Notes (Signed)
Roger LarsenMark Fisher - 52 y.o. male MRN [id number]  Date of birth: 07/08/1966  SUBJECTIVE:  Including CC & ROS.  Chief Complaint  Patient presents with  . Left shoulder pain    Roger LarsenMark Fisher is a 52 y.o. male that is presenting with left shoulder pain. Ongoing for two weeks. He noticed the pain and a lump after he was push mowing his yard. Located in the anterior position of his left shoulder. Admits to tingling and numbness. Pain with flexion and extension.  He has not taken anything for the pain. Denies injury or surgeries. There are no radicular symptoms. Pain is intermittent in nature.    Review of Systems  Constitutional: Negative for fever.  HENT: Negative for congestion.   Respiratory: Negative for cough.   Cardiovascular: Negative for chest pain.  Gastrointestinal: Negative for abdominal pain.  Musculoskeletal: Negative for gait problem.  Skin: Negative for color change.  Neurological: Negative for weakness.  Hematological: Negative for adenopathy.  Psychiatric/Behavioral: Negative for agitation.    HISTORY: Past Medical, Surgical, Social, and Family History Reviewed & Updated per EMR.   Pertinent Historical Findings include:  Past Medical History:  Diagnosis Date  . Arrhythmia   . Asthma   . Diabetes mellitus without complication (HCC)    type 2  . GERD (gastroesophageal reflux disease)   . Hyperlipidemia   . Hypertension   . PAF (paroxysmal atrial fibrillation) (HCC)     Past Surgical History:  Procedure Laterality Date  . APPENDECTOMY    . HERNIA REPAIR      No Known Allergies  Family History  Problem Relation Age of Onset  . Cancer Mother   . Alcohol abuse Father   . Early death Sister      Social History   Socioeconomic History  . Marital status: Married    Spouse name: Not on file  . Number of children: Not on file  . Years of education: Not on file  . Highest education level: Not on file  Occupational History  . Not on file  Social Needs  .  Financial resource strain: Not on file  . Food insecurity:    Worry: Not on file    Inability: Not on file  . Transportation needs:    Medical: Not on file    Non-medical: Not on file  Tobacco Use  . Smoking status: Former Games developermoker  . Smokeless tobacco: Never Used  Substance and Sexual Activity  . Alcohol use: Not Currently    Frequency: Never  . Drug use: Never  . Sexual activity: Not on file  Lifestyle  . Physical activity:    Days per week: Not on file    Minutes per session: Not on file  . Stress: Not on file  Relationships  . Social connections:    Talks on phone: Not on file    Gets together: Not on file    Attends religious service: Not on file    Active member of club or organization: Not on file    Attends meetings of clubs or organizations: Not on file    Relationship status: Not on file  . Intimate partner violence:    Fear of current or ex partner: Not on file    Emotionally abused: Not on file    Physically abused: Not on file    Forced sexual activity: Not on file  Other Topics Concern  . Not on file  Social History Narrative  . Not on file  PHYSICAL EXAM:  VS: BP 138/84 (BP Location: Right Arm, Patient Position: Sitting, Cuff Size: Normal)   Pulse 86   Ht 5\' 10"  (1.778 m)   Wt 213 lb (96.6 kg)   SpO2 94%   BMI 30.56 kg/m  Physical Exam Gen: NAD, alert, cooperative with exam, well-appearing ENT: normal lips, normal nasal mucosa,  Eye: normal EOM, normal conjunctiva and lids CV:  no edema, +2 pedal pulses   Resp: no accessory muscle use, non-labored,  Skin: no rashes, no areas of induration  Neuro: normal tone, normal sensation to touch Psych:  normal insight, alert and oriented MSK:  Shoulder: Inspection reveals no abnormalities, atrophy or asymmetry. Palpation is normal with no tenderness over AC joint  ROM is full in all planes. Normal ER and abduction  Rotator cuff strength normal throughout. No pain with Hawkin's tests Mild pain with  empty can sign. Speeds tests with mild pain  Mild pain with Obrien's Normal scapular function observed. Neurovascularly intact   Limited ultrasound: left shoulder:  Norma appearing BT in short and long axis  Normal appearing subscap  Soft tissue mass superficial to the subscap. This is not observed on the right side. Roughly 2.5 cm in diameter. This has vascular uptake associated with it.  Normal appearing suprapsinatus  AC with degenerative changes but no effusion   Summary: soft tissue mass appears in the anterior portion of the deltoid.   Ultrasound and interpretation by Clare Gandy, MD       ASSESSMENT & PLAN:   Soft tissue mass There appears to be a soft tissue mass superficial to the subscapularis.  This is occurring on the anterior shoulder and would represent why he feels a lump in that area.  His rotator cuff looks normal.  The acromial clavicular joint does have degenerative changes but no pain in that region. -X-ray today -[facility] -MRI of the left shoulder with and without contrast to evaluate the soft tissue mass

## 2018-05-02 ENCOUNTER — Ambulatory Visit (INDEPENDENT_AMBULATORY_CARE_PROVIDER_SITE_OTHER): Payer: BLUE CROSS/BLUE SHIELD

## 2018-05-02 ENCOUNTER — Encounter: Payer: Self-pay | Admitting: Family Medicine

## 2018-05-02 ENCOUNTER — Telehealth: Payer: Self-pay | Admitting: Family Medicine

## 2018-05-02 ENCOUNTER — Ambulatory Visit (INDEPENDENT_AMBULATORY_CARE_PROVIDER_SITE_OTHER): Payer: BLUE CROSS/BLUE SHIELD | Admitting: Family Medicine

## 2018-05-02 VITALS — BP 138/84 | HR 86 | Ht 70.0 in | Wt 213.0 lb

## 2018-05-02 DIAGNOSIS — M799 Soft tissue disorder, unspecified: Secondary | ICD-10-CM | POA: Diagnosis not present

## 2018-05-02 DIAGNOSIS — M25512 Pain in left shoulder: Secondary | ICD-10-CM

## 2018-05-02 DIAGNOSIS — M7989 Other specified soft tissue disorders: Secondary | ICD-10-CM

## 2018-05-02 MED ORDER — DICLOFENAC SODIUM 2 % TD SOLN: 1.0000 "application " | Freq: Two times a day (BID) | TRANSDERMAL | 3 refills | Status: DC

## 2018-05-02 NOTE — Patient Instructions (Signed)
Nice to meet you  They will call to schedule the MRI  I will call you once the MRI completed.

## 2018-05-02 NOTE — Assessment & Plan Note (Signed)
There appears to be a soft tissue mass superficial to the subscapularis.  This is occurring on the anterior shoulder and would represent why he feels a lump in that area.  His rotator cuff looks normal.  The acromial clavicular joint does have degenerative changes but no pain in that region. -X-ray today -[facility] -MRI of the left shoulder with and without contrast to evaluate the soft tissue mass

## 2018-05-02 NOTE — Telephone Encounter (Signed)
Informed patient of results.   Myra RudeSchmitz, Jeremy E, MD [facility] 05/02/2018, 5:10 PM

## 2018-05-11 ENCOUNTER — Ambulatory Visit: Payer: BLUE CROSS/BLUE SHIELD

## 2018-05-11 ENCOUNTER — Ambulatory Visit (HOSPITAL_BASED_OUTPATIENT_CLINIC_OR_DEPARTMENT_OTHER)
Admission: RE | Admit: 2018-05-11 | Discharge: 2018-05-11 | Disposition: A | Discharge status: Home / Self Care | Payer: BLUE CROSS/BLUE SHIELD | Source: Ambulatory Visit | Attending: Cardiology | Admitting: Cardiology

## 2018-05-11 DIAGNOSIS — R0789 Other chest pain: Secondary | ICD-10-CM | POA: Diagnosis not present

## 2018-05-11 DIAGNOSIS — I480 Paroxysmal atrial fibrillation: Secondary | ICD-10-CM

## 2018-05-11 DIAGNOSIS — I517 Cardiomegaly: Secondary | ICD-10-CM | POA: Insufficient documentation

## 2018-05-11 NOTE — Progress Notes (Addendum)
  Echocardiogram Echocardiogram Stress Test with limited exam has been performed.  Dorothey BasemanReel, Pennie Vanblarcom M 05/11/2018, 9:43 AM

## 2018-05-15 NOTE — Progress Notes (Addendum)
[facility] - Clinic Note  05/16/2018     CHIEF COMPLAINT Patient presents for Retina Evaluation and Diabetic Eye Exam   HISTORY OF PRESENT ILLNESS: Roger Fisher is a 52 y.o. male who presents to the clinic today for:   HPI    Retina Evaluation    In both eyes.  Associated Symptoms Negative for Distortion, Redness, Trauma, Shoulder/Hip pain, Weight Loss, Jaw Claudication, Glare, Pain, Fever, Scalp Tenderness, Photophobia, Blind Spot, Flashes and Floaters.  Context:  distance vision, mid-range vision and near vision.  I, the attending physician,  performed the HPI with the patient and updated documentation appropriately.          Diabetic Eye Exam    Vision is stable.  Associated Symptoms Negative for Distortion, Trauma, Shoulder/Hip pain, Redness, Fatigue, Weight Loss, Jaw Claudication, Glare, Pain, Floaters, Flashes, Blind Spot, Photophobia, Scalp Tenderness and Fever.  Diabetes characteristics include Type 2.  This started 1 year ago.  Blood sugar level fluctuates.  Last Blood Glucose 128.  I, the attending physician,  performed the HPI with the patient and updated documentation appropriately.          Comments    Referral of Dr. Ashley RoyaltyMatthews with [facility] for DME. Patient states he found out appx one year ago he was diabetic. Pt reports he did not take his medication like he should because he could not afford it, but now he is taking as instructed. Pt states his vision has been getting worse over the last three months, his vision has become blurry, eyes water a lot and he has light sensitivity. Bs fluctuates, but denies visual changes with BS. Bs this am 128, A1C unknown results, but states it was high.Pt is on Jardiance and Metformin. Denies vit's and gtt's       Last edited by Rennis ChrisZamora, Quinetta Shilling, MD on 05/16/2018  9:04 AM. (History)    Pt states he was referred by  Dr. Ashley RoyaltyMatthews (PCP), states he was whaving trouble getting medication due to expense; Pt states he is  now taking medication regularly now; Pt states he has been diabetic x 1 year and a few months; Pt states OU VA is blurred, states he has trouble driving at night and trouble reading; Pt endorses dx of HTN; Pt states he check CBG regularly now, states highest CBG has been within the last year was 500s;   Referring physician: Everrett CoombeMatthews, Cody, DO [address] [place], [place] [id number]  HISTORICAL INFORMATION:   Selected notes from the MEDICAL RECORD NUMBER Referred by Dr. Everrett Coombeody Matthews for DM exam LEE:  Ocular Hx- PMH-DM (A1C: 13.5, taking Metformin, Jardiance, Eliquis), asthma, hyperlipidemia, HTN    CURRENT MEDICATIONS: No current outpatient medications on file. (Ophthalmic Drugs)   No current facility-administered medications for this visit.  (Ophthalmic Drugs)   Current Outpatient Medications (Other)  Medication Sig  . apixaban (ELIQUIS) 5 MG TABS tablet Take 1 tablet (5 mg total) by mouth 2 (two) times daily.  Marland Kitchen. atorvastatin (LIPITOR) 20 MG tablet Take 1 tablet (20 mg total) by mouth daily.  . Diclofenac Sodium (PENNSAID) 2 % SOLN Place 1 application onto the skin 2 (two) times daily.  . diclofenac sodium (VOLTAREN) 1 % GEL Apply 4 g topically 4 (four) times daily.  . empagliflozin (JARDIANCE) 25 MG TABS tablet Take 25 mg by mouth daily.  Marland Kitchen. lisinopril (PRINIVIL,ZESTRIL) 5 MG tablet Take 1 tablet (5 mg total) by mouth daily.  . metFORMIN (GLUCOPHAGE-XR) 500 MG 24 hr tablet  Take 2 tablets (1,000 mg total) by mouth daily with breakfast.  . metoprolol succinate (TOPROL-XL) 25 MG 24 hr tablet Take 1 tablet (25 mg total) by mouth daily.  . sildenafil (VIAGRA) 100 MG tablet Take 0.5-1 tablets (50-100 mg total) by mouth daily as needed for erectile dysfunction.   No current facility-administered medications for this visit.  (Other)      REVIEW OF SYSTEMS: ROS    Positive for: Endocrine, Eyes   Negative for: Constitutional, Gastrointestinal, Neurological, Skin, Genitourinary,  Musculoskeletal, HENT, Cardiovascular, Respiratory, Psychiatric, Allergic/Imm, Heme/Lymph   Last edited by Eldridge Scot, LPN on 0/98/1191  8:42 AM. (History)       ALLERGIES No Known Allergies  PAST MEDICAL HISTORY Past Medical History:  Diagnosis Date  . Arrhythmia   . Asthma   . Diabetes mellitus without complication (HCC)    type 2  . GERD (gastroesophageal reflux disease)   . Hyperlipidemia   . Hypertension   . PAF (paroxysmal atrial fibrillation) (HCC)    Past Surgical History:  Procedure Laterality Date  . APPENDECTOMY    . HERNIA REPAIR      FAMILY HISTORY Family History  Problem Relation Age of Onset  . Cancer Mother   . Alcohol abuse Father   . Early death Sister     SOCIAL HISTORY Social History   Tobacco Use  . Smoking status: Former Games developer  . Smokeless tobacco: Never Used  Substance Use Topics  . Alcohol use: Not Currently    Frequency: Never  . Drug use: Never         OPHTHALMIC EXAM:  Base Eye Exam    Visual Acuity (Snellen - Linear)      Right Left   Dist [place] 20/40 20/50   Dist ph  20/25 20/40       Tonometry (Tonopen, 8:51 AM)      Right Left   Pressure 16 15       Pupils      Dark Light Shape React APD   Right 3 2 Round Brisk None   Left 3 2 Round Brisk None       Visual Fields (Counting fingers)      Left Right    Full Full       Extraocular Movement      Right Left    Full, Ortho Full, Ortho       Neuro/Psych    Oriented x3:  Yes       Dilation    Both eyes:  1.0% Mydriacyl, 2.5% Phenylephrine @ 8:51 AM        Slit Lamp and Fundus Exam    Slit Lamp Exam      Right Left   Lids/Lashes Mild Meibomian gland dysfunction Mild Meibomian gland dysfunction   Conjunctiva/Sclera Melanosis Melanosis   Cornea Arcus Arcus   Anterior Chamber Deep and quiet Deep and quiet   Iris Round and dilated, No NVI Round and dilated, No NVI   Lens 2+ Nuclear sclerosis, 2+ Cortical cataract 2+ Nuclear sclerosis, 2+ Cortical  cataract   Vitreous Vitreous syneresis Vitreous syneresis       Fundus Exam      Right Left   Disc Pink and Sharp Elongated vertically   C/D Ratio 0.6 0.6   Macula Flat, Good foveal reflex, Retinal pigment epithelial mottling, No heme or edema Good foveal reflex, Retinal pigment epithelial mottling, No heme or edema   Vessels Mild Vascular attenuation Mild Vascular attenuation, distal tortusity temporally  Periphery Attached, no heme Attached, no heme        Refraction    Manifest Refraction      Sphere Cylinder Dist VA   Right [place] Sphere 20/40+2   Left -0.50 Sphere 20/30-1          IMAGING AND PROCEDURES  Imaging and Procedures for @TODAY @  OCT, Retina - OU - Both Eyes       Right Eye Quality was good. Central Foveal Thickness: 242. Progression has no prior data. Findings include normal foveal contour, no IRF, no SRF.   Left Eye Quality was good. Central Foveal Thickness: 240. Progression has no prior data. Findings include normal foveal contour, no IRF, no SRF.   Notes *Images captured and stored on drive  Diagnosis / Impression:  No DME OU  Clinical management:  See below  Abbreviations: NFP - Normal foveal profile. CME - cystoid macular edema. PED - pigment epithelial detachment. IRF - intraretinal fluid. SRF - subretinal fluid. EZ - ellipsoid zone. ERM - epiretinal membrane. ORA - outer retinal atrophy. ORT - outer retinal tubulation. SRHM - subretinal hyper-reflective material                  ASSESSMENT/PLAN:    ICD-10-CM   1. Diabetes mellitus type 2 without retinopathy (HCC) E11.9   2. Hypertensive retinopathy of both eyes H35.033   3. Essential hypertension I10   4. Retinal edema H35.81 OCT, Retina - OU - Both Eyes  5. Combined forms of age-related cataract of both eyes H25.813   6. Refractive error H52.7     1. Diabetes mellitus, type 2 without retinopathy - The incidence, risk factors for progression, natural history and treatment  options for diabetic retinopathy  were discussed with patient.   - The need for close monitoring of blood glucose, blood pressure, and serum lipids, avoiding cigarette or any type of tobacco, and the need for long term follow up was also discussed with patient. - f/u in 6 months, DFE, OCT, FA (Optos, transit OS)  2,3. Hypertensive retinopathy OU - discussed importance of tight BP control - monitor  4. No retinal edema on exam or OCT  5. Combined form age-related cataract OU-  - The symptoms of cataract, surgical options, and treatments and risks were discussed with patient. - discussed diagnosis and progression - not yet visually significant - monitor for now  6. Refractive Error-  - discussed presbyopia - pt to return to [facility] for glasses Rx   Ophthalmic Meds Ordered this visit:  No orders of the defined types were placed in this encounter.      Return in about 6 months (around 11/16/2018) for F/U DM, DFE, OCT, FA.  There are no Patient Instructions on file for this visit.   Explained the diagnoses, plan, and follow up with the patient and they expressed understanding.  Patient expressed understanding of the importance of proper follow up care.   This document serves as a record of services personally performed by Karie Chimera, MD, PhD. It was created on their behalf by Laurian Brim, OA, an ophthalmic assistant. The creation of this record is the provider's dictation and/or activities during the visit.    Electronically signed by: Laurian Brim, OA  07.15.2019 9:32 AM   This document serves as a record of services personally performed by Karie Chimera, MD, PhD. It was created on their behalf by Virgilio Belling, COA, a certified ophthalmic assistant. The creation of this record is the provider's  dictation and/or activities during the visit.  Electronically signed by: Virgilio Belling, COA  07.16.19 9:32 AM   Karie Chimera, M.D., Ph.D. Diseases & Surgery of the  Retina and Vitreous [facility]   I have reviewed the above documentation for accuracy and completeness, and I agree with the above. Karie Chimera, M.D., Ph.D. 05/16/18 10:54 AM   Abbreviations: M myopia (nearsighted); A astigmatism; H hyperopia (farsighted); P presbyopia; Mrx spectacle prescription;  CTL contact lenses; OD right eye; OS left eye; OU both eyes  XT exotropia; ET esotropia; PEK punctate epithelial keratitis; PEE punctate epithelial erosions; DES dry eye syndrome; MGD meibomian gland dysfunction; ATs artificial tears; PFAT's preservative free artificial tears; NSC nuclear sclerotic cataract; PSC posterior subcapsular cataract; ERM epi-retinal membrane; PVD posterior vitreous detachment; RD retinal detachment; DM diabetes mellitus; DR diabetic retinopathy; NPDR non-proliferative diabetic retinopathy; PDR proliferative diabetic retinopathy; CSME clinically significant macular edema; DME diabetic macular edema; dbh dot blot hemorrhages; CWS cotton wool spot; POAG primary open angle glaucoma; C/D cup-to-disc ratio; HVF humphrey visual field; GVF goldmann visual field; OCT optical coherence tomography; IOP intraocular pressure; BRVO Branch retinal vein occlusion; CRVO central retinal vein occlusion; CRAO central retinal artery occlusion; BRAO branch retinal artery occlusion; RT retinal tear; SB scleral buckle; PPV pars plana vitrectomy; VH Vitreous hemorrhage; PRP panretinal laser photocoagulation; IVK intravitreal kenalog; VMT vitreomacular traction; MH Macular hole;  NVD neovascularization of the disc; NVE neovascularization elsewhere; AREDS age related eye disease study; ARMD age related macular degeneration; POAG primary open angle glaucoma; EBMD epithelial/anterior basement membrane dystrophy; ACIOL anterior chamber intraocular lens; IOL intraocular lens; PCIOL posterior chamber intraocular lens; Phaco/IOL phacoemulsification with intraocular lens placement; PRK  photorefractive keratectomy; LASIK laser assisted in situ keratomileusis; HTN hypertension; DM diabetes mellitus; COPD chronic obstructive pulmonary disease

## 2018-05-16 ENCOUNTER — Ambulatory Visit (INDEPENDENT_AMBULATORY_CARE_PROVIDER_SITE_OTHER): Payer: BLUE CROSS/BLUE SHIELD | Admitting: Ophthalmology

## 2018-05-16 ENCOUNTER — Encounter (INDEPENDENT_AMBULATORY_CARE_PROVIDER_SITE_OTHER): Payer: Self-pay | Admitting: Ophthalmology

## 2018-05-16 DIAGNOSIS — H3581 Retinal edema: Secondary | ICD-10-CM

## 2018-05-16 DIAGNOSIS — H35033 Hypertensive retinopathy, bilateral: Secondary | ICD-10-CM

## 2018-05-16 DIAGNOSIS — I10 Essential (primary) hypertension: Secondary | ICD-10-CM

## 2018-05-16 DIAGNOSIS — H527 Unspecified disorder of refraction: Secondary | ICD-10-CM

## 2018-05-16 DIAGNOSIS — E119 Type 2 diabetes mellitus without complications: Secondary | ICD-10-CM

## 2018-05-16 DIAGNOSIS — H25813 Combined forms of age-related cataract, bilateral: Secondary | ICD-10-CM

## 2018-05-18 ENCOUNTER — Ambulatory Visit: Payer: BLUE CROSS/BLUE SHIELD | Admitting: Cardiology

## 2018-05-19 ENCOUNTER — Ambulatory Visit: Payer: BLUE CROSS/BLUE SHIELD | Admitting: Family Medicine

## 2018-05-19 ENCOUNTER — Encounter: Payer: Self-pay | Admitting: Family Medicine

## 2018-05-19 ENCOUNTER — Other Ambulatory Visit: Payer: BLUE CROSS/BLUE SHIELD

## 2018-05-19 VITALS — BP 122/86 | HR 80 | Temp 98.3°F | Ht 70.0 in | Wt 209.2 lb

## 2018-05-19 DIAGNOSIS — I480 Paroxysmal atrial fibrillation: Secondary | ICD-10-CM | POA: Diagnosis not present

## 2018-05-19 DIAGNOSIS — E785 Hyperlipidemia, unspecified: Secondary | ICD-10-CM

## 2018-05-19 DIAGNOSIS — E1169 Type 2 diabetes mellitus with other specified complication: Secondary | ICD-10-CM | POA: Diagnosis not present

## 2018-05-19 DIAGNOSIS — E119 Type 2 diabetes mellitus without complications: Secondary | ICD-10-CM

## 2018-05-19 DIAGNOSIS — I10 Essential (primary) hypertension: Secondary | ICD-10-CM | POA: Diagnosis not present

## 2018-05-19 NOTE — Assessment & Plan Note (Signed)
Stable, followed by cardiology

## 2018-05-19 NOTE — Assessment & Plan Note (Signed)
Stable Tolerating atorvastatin well, continue Will plan to check fasting lipids at next visit.

## 2018-05-19 NOTE — Progress Notes (Signed)
Roger LarsenMark Fisher - 52 y.o. male MRN [id number]  Date of birth: 01/14/1966  Subjective Chief Complaint  Patient presents with  . Follow-up    HPI Roger LarsenMark Fisher is a 52 y.o. male with history of PAF, HTN, T2DM and hyperlipidemia here today for a follow up visit for diabetes.    -Diabetes:  Reports improvement of diabetes since previous visit.  He is compliant with medications and blood sugars at home recently are averaging around 120.  He has tried making improvements to his diet and tried starting walking more however has been doing this as often recently.  He did recently have his eye exam and was negative for diabetic retinopathy. He denies symptoms of hypoglycemia, polyuria/polydipsia.   -HTN:  Compliant with current medications.  Has history of PAF and is followed by cardiology as well.   BP at home have been well controlled.  Denies symptoms of hypotension.  He denies anginal symptoms, shortness of breath, palpitations, headache, dizziness or edema  -HLD:  Tolerating atorvastatin well, denies myalgias.    ROS:  A comprehensive ROS was completed and negative except as noted per HPI No Known Allergies  Past Medical History:  Diagnosis Date  . Arrhythmia   . Asthma   . Diabetes mellitus without complication (HCC)    type 2  . GERD (gastroesophageal reflux disease)   . Hyperlipidemia   . Hypertension   . PAF (paroxysmal atrial fibrillation) (HCC)     Past Surgical History:  Procedure Laterality Date  . APPENDECTOMY    . HERNIA REPAIR      Social History   Socioeconomic History  . Marital status: Married    Spouse name: Not on file  . Number of children: Not on file  . Years of education: Not on file  . Highest education level: Not on file  Occupational History  . Not on file  Social Needs  . Financial resource strain: Not on file  . Food insecurity:    Worry: Not on file    Inability: Not on file  . Transportation needs:    Medical: Not on file    Non-medical: Not on  file  Tobacco Use  . Smoking status: Former Games developermoker  . Smokeless tobacco: Never Used  Substance and Sexual Activity  . Alcohol use: Not Currently    Frequency: Never  . Drug use: Never  . Sexual activity: Not on file  Lifestyle  . Physical activity:    Days per week: Not on file    Minutes per session: Not on file  . Stress: Not on file  Relationships  . Social connections:    Talks on phone: Not on file    Gets together: Not on file    Attends religious service: Not on file    Active member of club or organization: Not on file    Attends meetings of clubs or organizations: Not on file    Relationship status: Not on file  Other Topics Concern  . Not on file  Social History Narrative  . Not on file    Family History  Problem Relation Age of Onset  . Cancer Mother   . Alcohol abuse Father   . Early death Sister     Health Maintenance  Topic Date Due  . PNEUMOCOCCAL POLYSACCHARIDE VACCINE (1) 02/06/1969  . FOOT EXAM  02/06/1977  . HIV Screening  02/06/1982  . TETANUS/TDAP  02/06/1986  . COLONOSCOPY  02/06/2017  . INFLUENZA VACCINE  06/01/2018  . HEMOGLOBIN  A1C  10/12/2018  . OPHTHALMOLOGY EXAM  05/17/2019    ----------------------------------------------------------------------------------------------------------------------------------------------------------------------------------------------------------------- Physical Exam BP 122/86 (BP Location: Left Arm, Patient Position: Sitting, Cuff Size: Normal)   Pulse 80   Temp 98.3 F (36.8 C) (Oral)   Ht 5\' 10"  (1.778 m)   Wt 209 lb 3.2 oz (94.9 kg)   SpO2 95%   BMI 30.02 kg/m   Physical Exam  Constitutional: He is oriented to person, place, and time. He appears well-nourished.  HENT:  Head: Normocephalic and atraumatic.  Mouth/Throat: Oropharynx is clear and moist.  Eyes: No scleral icterus.  Neck: Neck supple. No thyromegaly present.  Cardiovascular: Normal rate, regular rhythm, normal heart sounds and  intact distal pulses.  Pulmonary/Chest: Effort normal and breath sounds normal.  Musculoskeletal: He exhibits no edema.  Neurological: He is alert and oriented to person, place, and time.  Skin: Skin is warm and dry.  Psychiatric: He has a normal mood and affect. His behavior is normal.    ------------------------------------------------------------------------------------------------------------------------------------------------------------------------------------------------------------------- Assessment and Plan  Hyperlipidemia associated with type 2 diabetes mellitus (HCC) Stable Tolerating atorvastatin well, continue Will plan to check fasting lipids at next visit.    Type 2 diabetes mellitus without complication, without long-term current use of insulin (HCC) Improved Reported blood sugars are much improved at home Encouraged healthy diet with regular exercise Continue current medication F/u 2 months, repeat a1c at that time.   Essential hypertension BP is well controlled Continue current medication Follow low salt diet.   Paroxysmal atrial fibrillation (HCC) Stable, followed by cardiology.

## 2018-05-19 NOTE — Patient Instructions (Signed)
Continue current medications Follow up with me in 2 months

## 2018-05-19 NOTE — Assessment & Plan Note (Signed)
BP is well controlled Continue current medication Follow low salt diet 

## 2018-05-19 NOTE — Assessment & Plan Note (Signed)
Improved Reported blood sugars are much improved at home Encouraged healthy diet with regular exercise Continue current medication F/u 2 months, repeat a1c at that time.

## 2018-05-21 ENCOUNTER — Ambulatory Visit
Admission: RE | Admit: 2018-05-21 | Discharge: 2018-05-21 | Disposition: A | Discharge status: Home / Self Care | Payer: BLUE CROSS/BLUE SHIELD | Source: Ambulatory Visit | Attending: Family Medicine | Admitting: Family Medicine

## 2018-05-21 DIAGNOSIS — M25512 Pain in left shoulder: Secondary | ICD-10-CM

## 2018-05-21 MED ORDER — GADOBENATE DIMEGLUMINE 529 MG/ML IV SOLN
20.0000 mL | Freq: Once | INTRAVENOUS | Status: AC | PRN
  Administered 2018-05-21: 20 mL via INTRAVENOUS

## 2018-05-22 ENCOUNTER — Telehealth: Payer: Self-pay | Admitting: Family Medicine

## 2018-05-22 NOTE — Telephone Encounter (Signed)
Spoke with patient about MRI results. Will monitor for now   Myra RudeSchmitz, Jeremy E, MD [facility] 05/22/2018, 3:58 PM

## 2018-05-23 ENCOUNTER — Other Ambulatory Visit: Payer: BLUE CROSS/BLUE SHIELD

## 2018-05-25 ENCOUNTER — Ambulatory Visit: Payer: BLUE CROSS/BLUE SHIELD | Admitting: Cardiology

## 2018-05-26 ENCOUNTER — Encounter: Payer: Self-pay | Admitting: Cardiology

## 2018-07-21 ENCOUNTER — Ambulatory Visit: Payer: BLUE CROSS/BLUE SHIELD | Admitting: Family Medicine

## 2018-07-21 ENCOUNTER — Encounter: Payer: Self-pay | Admitting: Family Medicine

## 2018-07-21 VITALS — BP 120/76 | HR 80 | Temp 98.1°F | Ht 70.0 in | Wt 211.4 lb

## 2018-07-21 DIAGNOSIS — E1169 Type 2 diabetes mellitus with other specified complication: Secondary | ICD-10-CM | POA: Diagnosis not present

## 2018-07-21 DIAGNOSIS — I10 Essential (primary) hypertension: Secondary | ICD-10-CM

## 2018-07-21 DIAGNOSIS — L988 Other specified disorders of the skin and subcutaneous tissue: Secondary | ICD-10-CM | POA: Insufficient documentation

## 2018-07-21 DIAGNOSIS — I480 Paroxysmal atrial fibrillation: Secondary | ICD-10-CM

## 2018-07-21 DIAGNOSIS — E119 Type 2 diabetes mellitus without complications: Secondary | ICD-10-CM | POA: Diagnosis not present

## 2018-07-21 DIAGNOSIS — E785 Hyperlipidemia, unspecified: Secondary | ICD-10-CM

## 2018-07-21 MED ORDER — MICONAZOLE NITRATE 2 % EX POWD: CUTANEOUS | 1 refills | Status: DC | PRN

## 2018-07-21 NOTE — Progress Notes (Signed)
Roger Fisher - 52 y.o. male MRN [id number]  Date of birth: [birth year]  Subjective Chief Complaint  Patient presents with  . Follow-up  . Diabetes    HPI Roger Fisher is a 52 y.o. male with history of T2DM, HTN, hyperlipdemia and PAF here today for a follow up visit for the following:  -T2DM:  Current treatment includes metformin and jardiance.  He is doing well with current medications.   He has tried to make improvements to his diet but finds this difficult at times and often will skip meals during the day, typically breakfast.  Has had a couple of occasions where he feels like his blood sugar gets too low.  Overall readings at home range from low 100's-200.     -HTN:  Current treatment  With lisinopril and toprol xl, doing well with these.  He denies side effects including symptoms of hypotension.  He has worked on sodium reduction.  He denies chest pain, shortness of breath, palpitations, headache or vision changes.   -Hyperlipidemia:  Doing well with atorvastatin, denies side effects including myalgias or abdominal pain.   -PAF:  Follows with cardiology.  Rate controlled with Toprol-XL and is anticoagulated with eliquis for CHADS-VASc of 2.    ROS:  A comprehensive ROS was completed and negative except as noted per HPI  No Known Allergies  Past Medical History:  Diagnosis Date  . Arrhythmia   . Asthma   . Diabetes mellitus without complication (HCC)    type 2  . GERD (gastroesophageal reflux disease)   . Hyperlipidemia   . Hypertension   . PAF (paroxysmal atrial fibrillation) (HCC)     Past Surgical History:  Procedure Laterality Date  . APPENDECTOMY    . HERNIA REPAIR      Social History   Socioeconomic History  . Marital status: Married    Spouse name: Not on file  . Number of children: Not on file  . Years of education: Not on file  . Highest education level: Not on file  Occupational History  . Not on file  Social Needs  . Financial resource strain: Not on  file  . Food insecurity:    Worry: Not on file    Inability: Not on file  . Transportation needs:    Medical: Not on file    Non-medical: Not on file  Tobacco Use  . Smoking status: Former Games developer  . Smokeless tobacco: Never Used  Substance and Sexual Activity  . Alcohol use: Not Currently    Frequency: Never  . Drug use: Never  . Sexual activity: Not on file  Lifestyle  . Physical activity:    Days per week: Not on file    Minutes per session: Not on file  . Stress: Not on file  Relationships  . Social connections:    Talks on phone: Not on file    Gets together: Not on file    Attends religious service: Not on file    Active member of club or organization: Not on file    Attends meetings of clubs or organizations: Not on file    Relationship status: Not on file  Other Topics Concern  . Not on file  Social History Narrative  . Not on file    Family History  Problem Relation Age of Onset  . Cancer Mother   . Alcohol abuse Father   . Early death Sister     Health Maintenance  Topic Date Due  . FOOT EXAM  02/06/1977  . HIV Screening  02/06/1982  . TETANUS/TDAP  02/06/1986  . COLONOSCOPY  02/06/2017  . INFLUENZA VACCINE  07/18/2019 (Originally 06/01/2018)  . HEMOGLOBIN A1C  10/12/2018  . URINE MICROALBUMIN  04/13/2019  . OPHTHALMOLOGY EXAM  05/17/2019  . PNEUMOCOCCAL POLYSACCHARIDE VACCINE AGE 61-64 HIGH RISK  Completed    ----------------------------------------------------------------------------------------------------------------------------------------------------------------------------------------------------------------- Physical Exam BP 120/76 (BP Location: Left Arm, Patient Position: Sitting, Cuff Size: Normal)   Pulse 80   Temp 98.1 F (36.7 C) (Oral)   Ht 5\' 10"  (1.778 m)   Wt 211 lb 6.4 oz (95.9 kg)   SpO2 96%   BMI 30.33 kg/m   Physical Exam  Constitutional: He is oriented to person, place, and time. He appears well-nourished. No distress.    HENT:  Head: Normocephalic and atraumatic.  Mouth/Throat: Oropharynx is clear and moist.  Eyes: No scleral icterus.  Neck: Neck supple. No thyromegaly present.  Cardiovascular: Normal rate, regular rhythm and normal heart sounds.  Pulses:      Dorsalis pedis pulses are 1+ on the right side, and 1+ on the left side.       Posterior tibial pulses are 1+ on the right side, and 1+ on the left side.  Pulmonary/Chest: Effort normal and breath sounds normal.  Musculoskeletal: He exhibits no edema.       Right foot: There is normal range of motion and no deformity.       Left foot: There is normal range of motion and no deformity.  Feet:  Right Foot:  Protective Sensation: 4 sites tested. 0 sites sensed.  Skin Integrity: Positive for skin breakdown (maceration between toes. ).  Left Foot:  Protective Sensation: 4 sites tested. 4 sites sensed.  Skin Integrity: Positive for skin breakdown (maceration between toes).  Lymphadenopathy:    He has no cervical adenopathy.  Neurological: He is alert and oriented to person, place, and time. No cranial nerve deficit.  Skin: Skin is warm and dry.  Psychiatric: He has a normal mood and affect. His behavior is normal.    ------------------------------------------------------------------------------------------------------------------------------------------------------------------------------------------------------------------- Assessment and Plan  Type 2 diabetes mellitus without complication, without long-term current use of insulin (HCC) Previously poorly controlled however was off all medications  -Blood sugars are better controlled since restarting medications, continue -Update A1c -Reminded to follow low carb diet with regular exercise.  -F/u 3 months.  -Referral to nutrition as well.    Hyperlipidemia associated with type 2 diabetes mellitus (HCC) -tolerating atorvastatin well.  -Update lipids today.   Paroxysmal atrial fibrillation  (HCC) Followed by cardiology, doing well with current medications Adequately rate controlled Continue anticoagulation.   Essential hypertension BP is well controlled, continue current medications Information provided about DASH/Low sodium diet.   Maceration of skin Recommend trial of zeasorb, daily foot checks.

## 2018-07-21 NOTE — Assessment & Plan Note (Signed)
Followed by cardiology, doing well with current medications Adequately rate controlled Continue anticoagulation.

## 2018-07-21 NOTE — Assessment & Plan Note (Signed)
-  tolerating atorvastatin well.  -Update lipids today.

## 2018-07-21 NOTE — Assessment & Plan Note (Signed)
BP is well controlled, continue current medications Information provided about DASH/Low sodium diet.

## 2018-07-21 NOTE — Assessment & Plan Note (Addendum)
Previously poorly controlled however was off all medications  -Blood sugars are better controlled since restarting medications, continue -Update A1c -Reminded to follow low carb diet with regular exercise.  -F/u 3 months.  -Referral to nutrition as well.

## 2018-07-21 NOTE — Patient Instructions (Signed)
Diabetes Mellitus and Nutrition When you have diabetes (diabetes mellitus), it is very important to have healthy eating habits because your blood sugar (glucose) levels are greatly affected by what you eat and drink. Eating healthy foods in the appropriate amounts, at about the same times every day, can help you:  Control your blood glucose.  Lower your risk of heart disease.  Improve your blood pressure.  Reach or maintain a healthy weight.  Every person with diabetes is different, and each person has different needs for a meal plan. Your health care provider may recommend that you work with a diet and nutrition specialist (dietitian) to make a meal plan that is best for you. Your meal plan may vary depending on factors such as:  The calories you need.  The medicines you take.  Your weight.  Your blood glucose, blood pressure, and cholesterol levels.  Your activity level.  Other health conditions you have, such as heart or kidney disease.  How do carbohydrates affect me? Carbohydrates affect your blood glucose level more than any other type of food. Eating carbohydrates naturally increases the amount of glucose in your blood. Carbohydrate counting is a method for keeping track of how many carbohydrates you eat. Counting carbohydrates is important to keep your blood glucose at a healthy level, especially if you use insulin or take certain oral diabetes medicines. It is important to know how many carbohydrates you can safely have in each meal. This is different for every person. Your dietitian can help you calculate how many carbohydrates you should have at each meal and for snack. Foods that contain carbohydrates include:  Bread, cereal, rice, pasta, and crackers.  Potatoes and corn.  Peas, beans, and lentils.  Milk and yogurt.  Fruit and juice.  Desserts, such as cakes, cookies, ice cream, and candy.  How does alcohol affect me? Alcohol can cause a sudden decrease in blood  glucose (hypoglycemia), especially if you use insulin or take certain oral diabetes medicines. Hypoglycemia can be a life-threatening condition. Symptoms of hypoglycemia (sleepiness, dizziness, and confusion) are similar to symptoms of having too much alcohol. If your health care provider says that alcohol is safe for you, follow these guidelines:  Limit alcohol intake to no more than 1 drink per day for nonpregnant women and 2 drinks per day for men. One drink equals 12 oz of beer, 5 oz of wine, or 1 oz of hard liquor.  Do not drink on an empty stomach.  Keep yourself hydrated with water, diet soda, or unsweetened iced tea.  Keep in mind that regular soda, juice, and other mixers may contain a lot of sugar and must be counted as carbohydrates.  What are tips for following this plan? Reading food labels  Start by checking the serving size on the label. The amount of calories, carbohydrates, fats, and other nutrients listed on the label are based on one serving of the food. Many foods contain more than one serving per package.  Check the total grams (g) of carbohydrates in one serving. You can calculate the number of servings of carbohydrates in one serving by dividing the total carbohydrates by 15. For example, if a food has 30 g of total carbohydrates, it would be equal to 2 servings of carbohydrates.  Check the number of grams (g) of saturated and trans fats in one serving. Choose foods that have low or no amount of these fats.  Check the number of milligrams (mg) of sodium in one serving. Most people   should limit total sodium intake to less than 2,300 mg per day.  Always check the nutrition information of foods labeled as "low-fat" or "nonfat". These foods may be higher in added sugar or refined carbohydrates and should be avoided.  Talk to your dietitian to identify your daily goals for nutrients listed on the label. Shopping  Avoid buying canned, premade, or processed foods. These  foods tend to be high in fat, sodium, and added sugar.  Shop around the outside edge of the grocery store. This includes fresh fruits and vegetables, bulk grains, fresh meats, and fresh dairy. Cooking  Use low-heat cooking methods, such as baking, instead of high-heat cooking methods like deep frying.  Cook using healthy oils, such as olive, canola, or sunflower oil.  Avoid cooking with butter, cream, or high-fat meats. Meal planning  Eat meals and snacks regularly, preferably at the same times every day. Avoid going long periods of time without eating.  Eat foods high in fiber, such as fresh fruits, vegetables, beans, and whole grains. Talk to your dietitian about how many servings of carbohydrates you can eat at each meal.  Eat 4-6 ounces of lean protein each day, such as lean meat, chicken, fish, eggs, or tofu. 1 ounce is equal to 1 ounce of meat, chicken, or fish, 1 egg, or 1/4 cup of tofu.  Eat some foods each day that contain healthy fats, such as avocado, nuts, seeds, and fish. Lifestyle   Check your blood glucose regularly.  Exercise at least 30 minutes 5 or more days each week, or as told by your health care provider.  Take medicines as told by your health care provider.  Do not use any products that contain nicotine or tobacco, such as cigarettes and e-cigarettes. If you need help quitting, ask your health care provider.  Work with a counselor or diabetes educator to identify strategies to manage stress and any emotional and social challenges. What are some questions to ask my health care provider?  Do I need to meet with a diabetes educator?  Do I need to meet with a dietitian?  What number can I call if I have questions?  When are the best times to check my blood glucose? Where to find more information:  American Diabetes Association: diabetes.org/food-and-fitness/food  Academy of Nutrition and Dietetics:  www.eatright.org/resources/health/diseases-and-conditions/diabetes  National Institute of Diabetes and Digestive and Kidney Diseases (NIH): www.niddk.nih.gov/health-information/diabetes/overview/diet-eating-physical-activity Summary  A healthy meal plan will help you control your blood glucose and maintain a healthy lifestyle.  Working with a diet and nutrition specialist (dietitian) can help you make a meal plan that is best for you.  Keep in mind that carbohydrates and alcohol have immediate effects on your blood glucose levels. It is important to count carbohydrates and to use alcohol carefully. This information is not intended to replace advice given to you by your health care provider. Make sure you discuss any questions you have with your health care provider. Document Released: 07/15/2005 Document Revised: 11/22/2016 Document Reviewed: 11/22/2016 Elsevier Interactive Patient Education  2018 Elsevier Inc.  

## 2018-07-21 NOTE — Assessment & Plan Note (Signed)
Recommend trial of zeasorb, daily foot checks.
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
## 2018-07-25 ENCOUNTER — Telehealth: Payer: Self-pay

## 2018-07-25 NOTE — Telephone Encounter (Signed)
Left message for pt on 07/21/18 and 07/25/18 that [facility] need [facility]. Pt has been there before([facility]) I left [facility] address and my call back number.

## 2018-08-08 ENCOUNTER — Encounter: Payer: Self-pay | Admitting: Family Medicine

## 2018-08-09 NOTE — Telephone Encounter (Signed)
Left a VM for patient to give the office a call back regarding which medication he is requesting.

## 2018-08-10 NOTE — Telephone Encounter (Signed)
Patient request a written Rx for Viagra for 30 days supply. He can get it cheaper if he gets written Rx and take it to pharmacy that his friend refer him to. Please call the pt once its done.

## 2018-08-11 ENCOUNTER — Other Ambulatory Visit: Payer: Self-pay | Admitting: Family Medicine

## 2018-08-11 ENCOUNTER — Telehealth: Payer: Self-pay | Admitting: Emergency Medicine

## 2018-08-11 MED ORDER — SILDENAFIL CITRATE 100 MG PO TABS: 50.0000 mg | ORAL_TABLET | Freq: Every day | ORAL | 6 refills | Status: DC | PRN

## 2018-08-11 NOTE — Telephone Encounter (Signed)
Completed.

## 2018-08-11 NOTE — Telephone Encounter (Signed)
Spoke with patient regarding prescription being ready for pick up. Patient states that his wife will come to pick it up. Nothing further needed.

## 2018-08-16 ENCOUNTER — Ambulatory Visit: Payer: BLUE CROSS/BLUE SHIELD | Admitting: *Deleted

## 2018-10-09 ENCOUNTER — Other Ambulatory Visit: Payer: Self-pay | Admitting: Family Medicine

## 2018-10-20 ENCOUNTER — Ambulatory Visit: Payer: BLUE CROSS/BLUE SHIELD | Admitting: Family Medicine

## 2018-10-23 ENCOUNTER — Ambulatory Visit: Payer: BLUE CROSS/BLUE SHIELD | Admitting: Family Medicine

## 2018-10-23 ENCOUNTER — Telehealth: Payer: Self-pay | Admitting: *Deleted

## 2018-10-23 ENCOUNTER — Encounter: Payer: Self-pay | Admitting: Family Medicine

## 2018-10-23 VITALS — BP 148/72 | HR 82 | Temp 98.4°F | Ht 70.0 in | Wt 214.0 lb

## 2018-10-23 DIAGNOSIS — F5101 Primary insomnia: Secondary | ICD-10-CM | POA: Diagnosis not present

## 2018-10-23 DIAGNOSIS — E119 Type 2 diabetes mellitus without complications: Secondary | ICD-10-CM | POA: Diagnosis not present

## 2018-10-23 DIAGNOSIS — M722 Plantar fascial fibromatosis: Secondary | ICD-10-CM | POA: Insufficient documentation

## 2018-10-23 DIAGNOSIS — I10 Essential (primary) hypertension: Secondary | ICD-10-CM | POA: Diagnosis not present

## 2018-10-23 DIAGNOSIS — G4700 Insomnia, unspecified: Secondary | ICD-10-CM | POA: Insufficient documentation

## 2018-10-23 LAB — POCT GLYCOSYLATED HEMOGLOBIN (HGB A1C): Hemoglobin A1C: 6.7 % — AB (ref 4.0–5.6)

## 2018-10-23 MED ORDER — LISINOPRIL 5 MG PO TABS: 5.0000 mg | ORAL_TABLET | Freq: Every day | ORAL | 0 refills | Status: DC

## 2018-10-23 NOTE — Assessment & Plan Note (Signed)
-  Suggested trial of melatonin initially.

## 2018-10-23 NOTE — Progress Notes (Signed)
Roger LarsenMark Fisher - 52 y.o. male MRN [id number]  Date of birth: 04/20/1966  Subjective Chief Complaint  Patient presents with  . Follow-up    He has been having bilateral foot pain-has not been checking his sugars.     HPI Roger LarsenMark Fisher is a 52 y.o. male here today for follow up of HTN and T2DM.  He also has had difficulty with sleep and bilateral foot pain.   -HTN:  History of HTN with A. Fib.  He is followed by cardiology as well.  He has been compliant with medications although he has not taken yet today because he wasn't sure if he should fast. He denies side effects from medication including increased fatigue or symptoms of hypotension  He has not had chest pain, shortness of breath, palpitations, headache or vision changes.   -T2DM:  Reports that he is compliant with current medications however has not been checking blood sugars at home because he is out of strips.  He denies symptoms of hypoglycemia.   He remains fairly active from work.   -Foot pain:  Bilateral foot pain, worse with weight bearing especially when first getting up in the morning.  He denies increased numbness or tingling.  His job requires him to be on his feet for prolonged periods of time.   -Insomnia:  Has trouble going to sleep most nights and than tends to wake up early.  Typical night for him is 11pm-345am.  He feels well rested initially but is fatigued towards the end of the day.  He has not tried anything for management of this.   ROS:  A comprehensive ROS was completed and negative except as noted per HPI  No Known Allergies  Past Medical History:  Diagnosis Date  . Arrhythmia   . Asthma   . Diabetes mellitus without complication (HCC)    type 2  . GERD (gastroesophageal reflux disease)   . Hyperlipidemia   . Hypertension   . PAF (paroxysmal atrial fibrillation) (HCC)     Past Surgical History:  Procedure Laterality Date  . APPENDECTOMY    . HERNIA REPAIR      Social History   Socioeconomic  History  . Marital status: Married    Spouse name: Not on file  . Number of children: Not on file  . Years of education: Not on file  . Highest education level: Not on file  Occupational History  . Not on file  Social Needs  . Financial resource strain: Not on file  . Food insecurity:    Worry: Not on file    Inability: Not on file  . Transportation needs:    Medical: Not on file    Non-medical: Not on file  Tobacco Use  . Smoking status: Former Games developermoker  . Smokeless tobacco: Never Used  Substance and Sexual Activity  . Alcohol use: Not Currently    Frequency: Never  . Drug use: Never  . Sexual activity: Not on file  Lifestyle  . Physical activity:    Days per week: Not on file    Minutes per session: Not on file  . Stress: Not on file  Relationships  . Social connections:    Talks on phone: Not on file    Gets together: Not on file    Attends religious service: Not on file    Active member of club or organization: Not on file    Attends meetings of clubs or organizations: Not on file    Relationship status:  Not on file  Other Topics Concern  . Not on file  Social History Narrative  . Not on file    Family History  Problem Relation Age of Onset  . Cancer Mother   . Alcohol abuse Father   . Early death Sister     Health Maintenance  Topic Date Due  . FOOT EXAM  02/06/1977  . HIV Screening  02/06/1982  . TETANUS/TDAP  02/06/1986  . COLONOSCOPY  02/06/2017  . HEMOGLOBIN A1C  10/12/2018  . INFLUENZA VACCINE  07/18/2019 (Originally 06/01/2018)  . OPHTHALMOLOGY EXAM  05/17/2019  . PNEUMOCOCCAL POLYSACCHARIDE VACCINE AGE 31-64 HIGH RISK  Completed    ----------------------------------------------------------------------------------------------------------------------------------------------------------------------------------------------------------------- Physical Exam BP (!) 148/72   Pulse 82   Temp 98.4 F (36.9 C) (Oral)   Ht 5\' 10"  (1.778 m)   Wt 214 lb  (97.1 kg)   SpO2 97%   BMI 30.71 kg/m   Physical Exam Constitutional:      Appearance: Normal appearance.  HENT:     Head: Normocephalic and atraumatic.     Mouth/Throat:     Mouth: Mucous membranes are moist.  Eyes:     General: No scleral icterus. Neck:     Musculoskeletal: Neck supple.  Cardiovascular:     Rate and Rhythm: Normal rate and regular rhythm.  Pulmonary:     Effort: Pulmonary effort is normal.     Breath sounds: Normal breath sounds.  Skin:    General: Skin is warm and dry.  Neurological:     General: No focal deficit present.     Mental Status: He is alert.  Psychiatric:        Mood and Affect: Mood normal.        Behavior: Behavior normal.     ------------------------------------------------------------------------------------------------------------------------------------------------------------------------------------------------------------------- Assessment and Plan  Type 2 diabetes mellitus without complication, without long-term current use of insulin (HCC) Lab Results  Component Value Date   HGBA1C 6.7 (A) 10/23/2018  -Diabetes is fairly well controlled.  -Continue current medications -F/u 3 months.   Essential hypertension -BP elevated today, however has not taken medication.  Discussed he can take medication with water even if fasting.  He will take once he gets home today.  BP has been controlled previously on current medications.   Plantar fasciitis -Referral placed to sports medicine.   Insomnia -Suggested trial of melatonin initially.

## 2018-10-23 NOTE — Telephone Encounter (Signed)
*  STAT* If patient is at the pharmacy, call can be transferred to refill team.   1. Which medications need to be refilled? (please list name of each medication and dose if known) Lisinopril 5 mg qd  2. Which pharmacy/location (including street and city if local pharmacy) is medication to be sent to?[facility] in [place] on [address]  3. Do they need a 30 day or 90 day supply? 30

## 2018-10-23 NOTE — Assessment & Plan Note (Signed)
Referral placed to sports medicine

## 2018-10-23 NOTE — Assessment & Plan Note (Signed)
Lab Results  Component Value Date   HGBA1C 6.7 (A) 10/23/2018  -Diabetes is fairly well controlled.  -Continue current medications -F/u 3 months.

## 2018-10-23 NOTE — Patient Instructions (Addendum)
-  Continue current medications -Tylenol as needed for pain.  Avoid ibuprofen or aleve. -Referral placed to Dr. Jordan LikesSchmitz -Try melatonin 3-5mg  at bedtime for sleep -F/u with me in 3 months.

## 2018-10-23 NOTE — Assessment & Plan Note (Signed)
-  BP elevated today, however has not taken medication.  Discussed he can take medication with water even if fasting.  He will take once he gets home today.  BP has been controlled previously on current medications.

## 2018-10-29 ENCOUNTER — Other Ambulatory Visit: Payer: Self-pay | Admitting: Family Medicine

## 2018-10-30 ENCOUNTER — Ambulatory Visit: Payer: BLUE CROSS/BLUE SHIELD | Admitting: Family Medicine

## 2018-10-30 NOTE — Progress Notes (Signed)
A1c is shows that diabetes is fairly well controlled, continue current medications.

## 2018-11-15 ENCOUNTER — Encounter (INDEPENDENT_AMBULATORY_CARE_PROVIDER_SITE_OTHER): Payer: BLUE CROSS/BLUE SHIELD | Admitting: Ophthalmology

## 2018-12-05 ENCOUNTER — Other Ambulatory Visit: Payer: Self-pay | Admitting: Family Medicine

## 2018-12-05 MED ORDER — EMPAGLIFLOZIN 25 MG PO TABS: 25.0000 mg | ORAL_TABLET | Freq: Every day | ORAL | 0 refills | Status: DC

## 2018-12-05 NOTE — Telephone Encounter (Signed)
Copied from CRM [id number]. Topic: General - Other >> Dec 05, 2018 12:08 PM Leafy Ro wrote: Reason for CRM: pt is calling and per pt pharm fax refill request for jardiance 25 mg #30 w/refills. [facility] [address] in [place]. Pt is out of med

## 2018-12-05 NOTE — Telephone Encounter (Signed)
Refilled until appt Requested Prescriptions  Pending Prescriptions Disp Refills  . empagliflozin (JARDIANCE) 25 MG TABS tablet 90 tablet 0    Sig: Take 25 mg by mouth daily.     Endocrinology:  Diabetes - SGLT2 Inhibitors Failed - 12/05/2018 12:12 PM      Failed - LDL in normal range and within 360 days    No results found for: LDLCALC, LDLC, HIRISKLDL       Passed - Cr in normal range and within 360 days    Creatinine, Ser  Date Value Ref Range Status  04/12/2018 0.92 0.40 - 1.50 mg/dL Final         Passed - HBA1C is between 0 and 7.9 and within 180 days    Hemoglobin A1C  Date Value Ref Range Status  10/23/2018 6.7 (A) 4.0 - 5.6 % Final   Hgb A1c MFr Bld  Date Value Ref Range Status  04/12/2018 13.5 (H) 4.6 - 6.5 % Final    Comment:    Glycemic Control Guidelines for People with Diabetes:Non Diabetic:  <6%Goal of Therapy: <7%Additional Action Suggested:  >8%          Passed - eGFR in normal range and within 360 days    GFR calc Af Amer  Date Value Ref Range Status  01/11/2011  >60 mL/min Final   >60        The eGFR has been calculated using the MDRD equation. This calculation has not been validated in all clinical situations. eGFR's persistently <60 mL/min signify possible Chronic Kidney Disease.   GFR calc non Af Amer  Date Value Ref Range Status  01/11/2011 >60 >60 mL/min Final   GFR  Date Value Ref Range Status  04/12/2018 111.47 >60.00 mL/min Final         Passed - Valid encounter within last 6 months    Recent Outpatient Visits          1 month ago Type 2 diabetes mellitus without complication, without long-term current use of insulin [facility])   [facility] [place] Matthews, [place], DO   4 months ago Hyperlipidemia associated with type 2 diabetes mellitus [facility])   [facility], [place], DO   6 months ago Type 2 diabetes mellitus without complication, without long-term current use of insulin [facility])   [facility]  [place]   7 months ago Acute pain of left shoulder   [facility] Rosemarie Ax, MD   7 months ago Acute pain of left shoulder   [facility] [place] Zigmund Daniel, [place], DO      Future Appointments            In 1 month Luetta Nutting, DO [facility] [place]

## 2018-12-15 ENCOUNTER — Encounter (INDEPENDENT_AMBULATORY_CARE_PROVIDER_SITE_OTHER): Payer: BLUE CROSS/BLUE SHIELD | Admitting: Ophthalmology

## 2019-01-08 ENCOUNTER — Other Ambulatory Visit: Payer: Self-pay | Admitting: Family Medicine

## 2019-01-08 MED ORDER — ATORVASTATIN CALCIUM 20 MG PO TABS: 20.0000 mg | ORAL_TABLET | Freq: Every day | ORAL | 0 refills | Status: DC

## 2019-01-08 NOTE — Telephone Encounter (Signed)
Copied from CRM [id number]. Topic: Quick Communication - See Telephone Encounter >> Jan 08, 2019 11:31 AM Trula Slade wrote: CRM for notification. See Telephone encounter for: 01/08/19. Patient would like his atorvastatin (LIPITOR) 20 MG tablet medication refilled and sent to his preferred pharmacy [facility] [address] In [place], [place]

## 2019-01-12 ENCOUNTER — Other Ambulatory Visit: Payer: Self-pay

## 2019-01-12 ENCOUNTER — Other Ambulatory Visit: Payer: Self-pay | Admitting: Family Medicine

## 2019-01-12 DIAGNOSIS — E785 Hyperlipidemia, unspecified: Principal | ICD-10-CM

## 2019-01-12 DIAGNOSIS — E1169 Type 2 diabetes mellitus with other specified complication: Secondary | ICD-10-CM

## 2019-01-12 DIAGNOSIS — E119 Type 2 diabetes mellitus without complications: Secondary | ICD-10-CM

## 2019-01-12 MED ORDER — ATORVASTATIN CALCIUM 20 MG PO TABS: 20.0000 mg | ORAL_TABLET | Freq: Every day | ORAL | 0 refills | Status: DC

## 2019-01-12 MED ORDER — LISINOPRIL 5 MG PO TABS: 5.0000 mg | ORAL_TABLET | Freq: Every day | ORAL | 0 refills | Status: DC

## 2019-01-12 MED ORDER — EMPAGLIFLOZIN 25 MG PO TABS: 25.0000 mg | ORAL_TABLET | Freq: Every day | ORAL | 0 refills | Status: DC

## 2019-01-22 ENCOUNTER — Telehealth (INDEPENDENT_AMBULATORY_CARE_PROVIDER_SITE_OTHER): Payer: BLUE CROSS/BLUE SHIELD | Admitting: Family Medicine

## 2019-01-22 ENCOUNTER — Encounter: Payer: Self-pay | Admitting: Family Medicine

## 2019-01-22 DIAGNOSIS — E119 Type 2 diabetes mellitus without complications: Secondary | ICD-10-CM

## 2019-01-22 DIAGNOSIS — I480 Paroxysmal atrial fibrillation: Secondary | ICD-10-CM | POA: Diagnosis not present

## 2019-01-22 DIAGNOSIS — I10 Essential (primary) hypertension: Secondary | ICD-10-CM | POA: Diagnosis not present

## 2019-01-22 DIAGNOSIS — K219 Gastro-esophageal reflux disease without esophagitis: Secondary | ICD-10-CM | POA: Insufficient documentation

## 2019-01-22 MED ORDER — METFORMIN HCL ER 500 MG PO TB24: ORAL_TABLET | ORAL | 1 refills | Status: DC

## 2019-01-22 MED ORDER — LISINOPRIL 5 MG PO TABS: 5.0000 mg | ORAL_TABLET | Freq: Every day | ORAL | 1 refills | Status: DC

## 2019-01-22 MED ORDER — PANTOPRAZOLE SODIUM 40 MG PO TBEC: 40.0000 mg | DELAYED_RELEASE_TABLET | Freq: Every day | ORAL | 3 refills | Status: DC

## 2019-01-22 MED ORDER — EMPAGLIFLOZIN 25 MG PO TABS: 25.0000 mg | ORAL_TABLET | Freq: Every day | ORAL | 1 refills | Status: DC

## 2019-01-22 MED ORDER — ATORVASTATIN CALCIUM 20 MG PO TABS: 20.0000 mg | ORAL_TABLET | Freq: Every day | ORAL | 1 refills | Status: DC

## 2019-01-22 NOTE — Assessment & Plan Note (Signed)
Blood sugars stable, last a1c indicates glucose is fairly well controlled.  I think we can check his A1c again in 3 months.   Continue current medications.

## 2019-01-22 NOTE — Assessment & Plan Note (Addendum)
-  home BP reading elevated but tends to fluctuate. No symptoms at this time, he will continue current medications.

## 2019-01-22 NOTE — Progress Notes (Signed)
Virtual Visit via Telephone Note  I connected with Roger Fisher on 01/22/19 at 10:30 AM EDT by telephone and verified that I am speaking with the correct person using two identifiers.   I discussed the limitations, risks, security and privacy concerns of performing an evaluation and management service by telephone and the availability of in person appointments. I also discussed with the patient that there may be a patient responsible charge related to this service. The patient expressed understanding and agreed to proceed.   History of Present Illness: Roger Fisher is a age male with history of HTN, T2DM, PAF contacted for virtual visit.  He reports he is doing well.  Having some epigastric burning pain and reflux symptoms.  Not currently taking anything for GERD.  He denies associated nausea, fever or dark stool.  In regards to diabetes his blood sugars have ranged from 120-140.  He denies any symptoms of hypoglycemia.   Lab Results  Component Value Date   HGBA1C 6.7 (A) 10/23/2018    His blood pressures fluctuate some at home, admits to increased anxiety due to COVID-19 and that his daughter is stuck in [place] due to limited flights.  He continues to follow with cardiology for PAF.  He denies chest pain, shortness of breath, or dizziness.    Observations/Objective: BP (!) 167/98 Comment: took at home during telephone visit.  Pulse 88  CBG 119 on home monitor  Normal conversation without dyspnea or distress.   Assessment and Plan: Paroxysmal atrial fibrillation (HCC) Reports stable symptoms, continue current medication and scheduled f/u with cardiology.   Type 2 diabetes mellitus without complication, without long-term current use of insulin (HCC) Blood sugars stable, last a1c indicates glucose is fairly well controlled.  I think we can check his A1c again in 3 months.   Continue current medications.   Essential hypertension -home BP reading elevated but tends to fluctuate. No  symptoms at this time, he will continue current medications.     GERD (gastroesophageal reflux disease) -Symptoms consistent with GERD, start protonix.       Follow Up Instructions:    I discussed the assessment and treatment plan with the patient. The patient was provided an opportunity to ask questions and all were answered. The patient agreed with the plan and demonstrated an understanding of the instructions.   The patient was advised to call back or seek an in-person evaluation if the symptoms worsen or if the condition fails to improve as anticipated.  I provided 17 minutes of non-face-to-face time during this encounter.   Everrett Coombe, DO

## 2019-01-22 NOTE — Assessment & Plan Note (Signed)
Reports stable symptoms, continue current medication and scheduled f/u with cardiology.

## 2019-01-22 NOTE — Assessment & Plan Note (Signed)
-  Symptoms consistent with GERD, start protonix.

## 2019-01-26 ENCOUNTER — Ambulatory Visit (INDEPENDENT_AMBULATORY_CARE_PROVIDER_SITE_OTHER): Payer: BLUE CROSS/BLUE SHIELD | Admitting: Family Medicine

## 2019-01-26 ENCOUNTER — Encounter: Payer: Self-pay | Admitting: Family Medicine

## 2019-01-26 ENCOUNTER — Ambulatory Visit: Payer: Self-pay | Admitting: *Deleted

## 2019-01-26 DIAGNOSIS — K047 Periapical abscess without sinus: Secondary | ICD-10-CM | POA: Diagnosis not present

## 2019-01-26 MED ORDER — TRAMADOL HCL 50 MG PO TABS: 50.0000 mg | ORAL_TABLET | Freq: Three times a day (TID) | ORAL | 0 refills | Status: DC | PRN

## 2019-01-26 MED ORDER — AMOXICILLIN 500 MG PO CAPS: 500.0000 mg | ORAL_CAPSULE | Freq: Three times a day (TID) | ORAL | 0 refills | Status: DC

## 2019-01-26 NOTE — Assessment & Plan Note (Signed)
-  Rx for amoxicillin -Tramadol for pain control as he has not had adequate relief with tylenol and NSAIDS contraindicated due to anticoagulation.  -Recommend that he schedule with a dentist for extraction of tooth.  -Discussed signs and symptoms of that should prompt him to see emergency care.

## 2019-01-26 NOTE — Progress Notes (Signed)
Roger Fisher - 53 y.o. male MRN [id number]  Date of birth: [birth year]   This visit type was conducted due to national recommendations for restrictions regarding the COVID-19 Pandemic (e.g. social distancing).  This format is felt to be most appropriate for this patient at this time.  All issues noted in this document were discussed and addressed.  No physical exam was performed (except for noted visual exam findings with Video Visits).  I discussed the limitations of evaluation and management by telemedicine and the availability of in person appointments. The patient expressed understanding and agreed to proceed.  I connected with@ on 01/26/19 at  3:30 PM EDT by a video enabled telemedicine application and verified that I am speaking with the correct person using two identifiers.   Patient Location: Home [address] [place] [address]   Provider location:   [facility]  Chief Complaint  Patient presents with  . Dental Pain    3 days episodic , no swelling,pain more at sleep,left upper molar, no dental insurance    HPI  Roger Fisher is a 53 y.o. male who presents via audio/video conferencing for a telehealth visit today.  He reports that he has had pain in upper teeth gums for about 3 days.   Reports eating chicken a few days ago and a piece of chicken became stuck in a broken tooth and he started having pain shortly afterwards.  He has had dental infections in the past and this is similar.  He denies fever, chills, shortness of breath, difficulty swallowing, jaw or cheek swallowing.     ROS:  A comprehensive ROS was completed and negative except as noted per HPI  Past Medical History:  Diagnosis Date  . Arrhythmia   . Asthma   . Diabetes mellitus without complication (HCC)    type 2  . GERD (gastroesophageal reflux disease)   . Hyperlipidemia   . Hypertension   . PAF (paroxysmal atrial fibrillation) (HCC)     Past Surgical History:  Procedure Laterality Date  .  APPENDECTOMY    . HERNIA REPAIR      Family History  Problem Relation Age of Onset  . Cancer Mother   . Alcohol abuse Father   . Early death Sister     Social History   Socioeconomic History  . Marital status: Married    Spouse name: Not on file  . Number of children: Not on file  . Years of education: Not on file  . Highest education level: Not on file  Occupational History  . Not on file  Social Needs  . Financial resource strain: Not on file  . Food insecurity:    Worry: Not on file    Inability: Not on file  . Transportation needs:    Medical: Not on file    Non-medical: Not on file  Tobacco Use  . Smoking status: Former Games developer  . Smokeless tobacco: Never Used  Substance and Sexual Activity  . Alcohol use: Not Currently    Frequency: Never  . Drug use: Never  . Sexual activity: Not on file  Lifestyle  . Physical activity:    Days per week: Not on file    Minutes per session: Not on file  . Stress: Not on file  Relationships  . Social connections:    Talks on phone: Not on file    Gets together: Not on file    Attends religious service: Not on file    Active member of  club or organization: Not on file    Attends meetings of clubs or organizations: Not on file    Relationship status: Not on file  . Intimate partner violence:    Fear of current or ex partner: Not on file    Emotionally abused: Not on file    Physically abused: Not on file    Forced sexual activity: Not on file  Other Topics Concern  . Not on file  Social History Narrative  . Not on file     Current Outpatient Medications:  .  apixaban (ELIQUIS) 5 MG TABS tablet, Take 1 tablet (5 mg total) by mouth 2 (two) times daily., Disp: 60 tablet, Rfl: 5 .  atorvastatin (LIPITOR) 20 MG tablet, Take 1 tablet (20 mg total) by mouth daily., Disp: 90 tablet, Rfl: 1 .  empagliflozin (JARDIANCE) 25 MG TABS tablet, Take 25 mg by mouth daily., Disp: 90 tablet, Rfl: 1 .  lisinopril (PRINIVIL,ZESTRIL) 5  MG tablet, Take 1 tablet (5 mg total) by mouth daily., Disp: 90 tablet, Rfl: 1 .  metFORMIN (GLUCOPHAGE-XR) 500 MG 24 hr tablet, TAKE 2 TABLETS BY MOUTH ONCE DAILY WITH BREAKFAST, Disp: 180 tablet, Rfl: 1 .  metoprolol succinate (TOPROL-XL) 25 MG 24 hr tablet, TAKE 1 TABLET BY MOUTH ONCE DAILY, Disp: 90 tablet, Rfl: 0 .  miconazole (ZEASORB-AF) 2 % powder, Apply topically as needed for itching., Disp: 70 g, Rfl: 1 .  pantoprazole (PROTONIX) 40 MG tablet, Take 1 tablet (40 mg total) by mouth daily., Disp: 30 tablet, Rfl: 3 .  sildenafil (VIAGRA) 100 MG tablet, Take 0.5-1 tablets (50-100 mg total) by mouth daily as needed for erectile dysfunction., Disp: 30 tablet, Rfl: 6 .  amoxicillin (AMOXIL) 500 MG capsule, Take 1 capsule (500 mg total) by mouth 3 (three) times daily., Disp: 30 capsule, Rfl: 0 .  traMADol (ULTRAM) 50 MG tablet, Take 1 tablet (50 mg total) by mouth every 8 (eight) hours as needed., Disp: 15 tablet, Rfl: 0  EXAM:  VITALS per patient if applicable: BP (!) 144/94 Comment: Pt has his own personal cuff at home, Pt reports reading  Pulse 90 Comment: Pt report from home  Temp (!) 97.2 F (36.2 C) (Oral)   Wt 215 lb (97.5 kg) Comment: Pt reports from home  BMI 30.85 kg/m   GENERAL: alert, oriented, appears well and in no acute distress  HEENT: atraumatic, conjunttiva clear. Upper gum appears inflamed swollen.  Poor dentition with a couple of broken teeth  NECK: normal movements of the head and neck  LUNGS: on inspection no signs of respiratory distress, breathing rate appears normal, no obvious gross SOB, gasping or wheezing   CV: no obvious cyanosis   PSYCH/NEURO: pleasant and cooperative, no obvious depression or anxiety, speech and thought processing grossly intact  ASSESSMENT AND PLAN:  Discussed the following assessment and plan:  Dental abscess -Rx for amoxicillin -Tramadol for pain control as he has not had adequate relief with tylenol and NSAIDS contraindicated  due to anticoagulation.  -Recommend that he schedule with a dentist for extraction of tooth.  -Discussed signs and symptoms of that should prompt him to see emergency care.        I discussed the assessment and treatment plan with the patient. The patient was provided an opportunity to ask questions and all were answered. The patient agreed with the plan and demonstrated an understanding of the instructions.   The patient was advised to call back or seek an in-person evaluation if  the symptoms worsen or if the condition fails to improve as anticipated.  I provided 15 minutes of non-face-to-face time during this encounter.   Everrett Coombe, DO

## 2019-01-26 NOTE — Patient Instructions (Signed)
Dental Abscess  A dental abscess is an area of pus in or around a tooth. It comes from an infection. It can cause pain and other symptoms. Treatment will help with symptoms and prevent the infection from spreading. Follow these instructions at home: Medicines  Take over-the-counter and prescription medicines only as told by your dentist.  If you were prescribed an antibiotic medicine, take it as told by your dentist. Do not stop taking it even if you start to feel better.  If you were prescribed a gel that has numbing medicine in it, use it exactly as told.  Do not drive or use heavy machinery (like a lawn mower) while taking prescription pain medicine. General instructions  Rinse out your mouth often with salt water. ? To make salt water, dissolve -1 tsp of salt in 1 cup of warm water.  Eat a soft diet while your mouth is healing.  Drink enough fluid to keep your urine pale yellow.  Do not apply heat to the outside of your mouth.  Do not use any products that contain nicotine or tobacco. These include cigarettes and e-cigarettes. If you need help quitting, ask your doctor.  Keep all follow-up visits as told by your dentist. This is important. Prevent an abscess  Brush your teeth every morning and every night. Use fluoride toothpaste.  Floss your teeth each day.  Get dental cleanings as often as told by your dentist.  Think about getting dental sealant put on teeth that have deep holes (decay).  Drink water that has fluoride in it. ? Most tap water has fluoride. ? Check the label on bottled water to see if it has fluoride in it.  Drink water instead of sugary drinks.  Eat healthy meals and snacks.  Wear a mouth guard or face shield when you play sports. Contact a doctor if:  Your pain is worse, and medicine does not help. Get help right away if:  You have a fever or chills.  Your symptoms suddenly get worse.  You have a very bad headache.  You have problems  breathing or swallowing.  You have trouble opening your mouth.  You have swelling in your neck or close to your eye. Summary  A dental abscess is an area of pus in or around a tooth. It is caused by an infection.  Treatment will help with symptoms and prevent the infection from spreading.  Take over-the-counter and prescription medicines only as told by your dentist.  To prevent an abscess, take good care of your teeth. Brush your teeth every morning and night. Use floss every day.  Get dental cleanings as often as told by your dentist. This information is not intended to replace advice given to you by your health care provider. Make sure you discuss any questions you have with your health care provider. Document Released: 03/04/2015 Document Revised: 06/20/2017 Document Reviewed: 06/20/2017 Elsevier Interactive Patient Education  2019 Elsevier Inc.  

## 2019-01-26 NOTE — Telephone Encounter (Signed)
Attempted to contact pt per his voicemail; pt states that he has been awake for 2 days with a toothache, and would like to have an antibiotic and pain medication called into his pharmacy; left message on voicemail [phone] for pt to call office; pt is normally seen by Dr Everrett Coombe, [facility].

## 2019-01-26 NOTE — Telephone Encounter (Signed)
Contacted pt regarding his symptoms; he says that he had a hole in his tooth and for the last 3 days he had pain; he rates his pain is 5-6 out of 10, and is worse at night;the pt says that he used "kanka liquid" which gives him relief for about 1/2 hour;  he does not have a dentist; the pt normally sees Dr Everrett Coombe and would like something for pain, and an antibiotic; he would like to have something sent to his pharmacy [facility] [address] [facility]; the pt says that if needed he will do a virtual visit: he agrees to see any other provider if Dr Ashley Royalty is not available; het can be contacted at [phone], and a message can be left; his email address is [email or web address]; conference call initiated with [facility], and a virtual visit will be granted; per [facility], pt scheduled for virtual appointment with Dr [facility], 01/26/2019 at 1530; he verbalized understanding; will also route to office for notification.  Reason for Disposition . Toothache present > 24 hours  Answer Assessment - Initial Assessment Questions 1. LOCATION: "Which tooth is hurting?"  (e.g., right-side/left-side, upper/lower, front/back)     Upper left side 2. ONSET: "When did the toothache start?"  (e.g., hours, days)      01/22/2019 3. SEVERITY: "How bad is the toothache?"  (Scale 1-10; mild, moderate or severe)   - MILD (1-3): doesn't interfere with chewing    - MODERATE (4-7): interferes with chewing, interferes with normal activities, awakens from sleep     - SEVERE (8-10): unable to eat, unable to do any normal activities, excruciating pain        5-6 out of 10 4. SWELLING: "Is there any visible swelling of your face?"     Not sure 5. OTHER SYMPTOMS: "Do you have any other symptoms?" (e.g., fever)     no 6. PREGNANCY: "Is there any chance you are pregnant?" "When was your last menstrual period?"    n/a  Protocols used: TOOTHACHE-A-AH

## 2019-01-26 NOTE — Telephone Encounter (Signed)
Dr. Donnelly Stager appointment was made for you at 3:30

## 2019-03-19 ENCOUNTER — Other Ambulatory Visit: Payer: Self-pay | Admitting: Family Medicine

## 2019-03-19 ENCOUNTER — Encounter: Payer: Self-pay | Admitting: Family Medicine

## 2019-03-19 NOTE — Telephone Encounter (Signed)
Letter completed and should be available to print from [place].

## 2019-03-20 ENCOUNTER — Encounter: Payer: Self-pay | Admitting: Family Medicine

## 2019-03-21 ENCOUNTER — Telehealth: Payer: Self-pay | Admitting: Family Medicine

## 2019-03-21 NOTE — Telephone Encounter (Signed)
Called Pt . Please see 03/21/2019 encounter.

## 2019-03-21 NOTE — Telephone Encounter (Signed)
Called Pt he is doing fine now. BP is now good,174/97 , no pain , didn't want to call to get help, right side of chest , woke up from sleep , now gone. Pt reports he didn't eat dinner prior to this episode. Requested for Pt to take b.p. : 165/105 ( right arm) , 183/112. Reported to Dr. Ashley Royalty . Per Dr.mattehws , Approved to have Pt come in for nurse visit to have BP reading . Returned call back to Pt, suggested that batteries may need to be replaced. Pt will do that now, Wife had car at the time of the call. Pt stated he will return call back to report new reading.  3:19pm  Returned call back , Reported to Dr. Ashley Royalty about all readings, Per Dr. Ashley Royalty , have Pt increase his Lisinopril 5 mg to 10 mg in the am. Repeated it back to Pt. Suggested to take B.P. readings for the next few days and report back. If he needs to be seen or Rx changed to higher dose, to call or MyChart message.

## 2019-03-21 NOTE — Telephone Encounter (Signed)
Copied from CRM [id number]. Topic: Quick Communication - See Telephone Encounter >> Mar 21, 2019  2:07 PM Roger Fisher, [name] wrote: CRM for notification. See Telephone encounter for: 03/21/19. Patient called and would like a call back from Dr. Ashley Royalty CMA regarding his BP reading. Pt took it today at 2:05 L.Arm 164/85 and R.Arm 164/109. Please call patient back, thanks.

## 2019-04-03 ENCOUNTER — Other Ambulatory Visit: Payer: Self-pay | Admitting: Family Medicine

## 2019-04-05 ENCOUNTER — Telehealth: Payer: Self-pay

## 2019-04-05 DIAGNOSIS — E119 Type 2 diabetes mellitus without complications: Secondary | ICD-10-CM

## 2019-04-05 MED ORDER — METFORMIN HCL 500 MG PO TABS: 500.0000 mg | ORAL_TABLET | Freq: Two times a day (BID) | ORAL | 1 refills | Status: DC

## 2019-04-05 NOTE — Telephone Encounter (Signed)
Please change to  regular metformin 500mg  BID dispense #180 w/1 refill

## 2019-04-05 NOTE — Telephone Encounter (Signed)
Sent in Metformin 500mg  QT#180 R1 per Dr. Ashley Royalty order. Recall on Metformin XR 500 mg .

## 2019-04-05 NOTE — Telephone Encounter (Signed)
Sent to Dr. Ashley Royalty for review.

## 2019-04-05 NOTE — Addendum Note (Signed)
Addended by: Maryelizabeth Rowan A on: 04/05/2019 11:45 AM   Modules accepted: Orders

## 2019-04-05 NOTE — Telephone Encounter (Signed)
Completed. Closing task.

## 2019-04-05 NOTE — Telephone Encounter (Signed)
Copied from CRM [id number]. Topic: General - Other >> Apr 05, 2019 10:59 AM Tamela Oddi wrote: Reason for CRM: Patient called to inform the nurse or doctor that his medication, metFORMIN (GLUCOPHAGE-XR) 500 MG 24 hr tablet, has a recall and he does not have any more of the medication.  The patient's pharmacy told him to call his doctor to get another script sent in.  CB# [phone]

## 2019-05-01 ENCOUNTER — Encounter: Payer: Self-pay | Admitting: Family Medicine

## 2019-05-01 ENCOUNTER — Other Ambulatory Visit: Payer: Self-pay | Admitting: Family Medicine

## 2019-05-01 DIAGNOSIS — I10 Essential (primary) hypertension: Secondary | ICD-10-CM

## 2019-05-02 ENCOUNTER — Other Ambulatory Visit: Payer: Self-pay

## 2019-05-02 DIAGNOSIS — I10 Essential (primary) hypertension: Secondary | ICD-10-CM

## 2019-05-02 MED ORDER — LISINOPRIL 5 MG PO TABS: 5.0000 mg | ORAL_TABLET | Freq: Every day | ORAL | 0 refills | Status: DC

## 2019-05-16 ENCOUNTER — Encounter: Payer: Self-pay | Admitting: Family Medicine

## 2019-05-17 ENCOUNTER — Telehealth (INDEPENDENT_AMBULATORY_CARE_PROVIDER_SITE_OTHER): Payer: BLUE CROSS/BLUE SHIELD | Admitting: Family Medicine

## 2019-05-17 ENCOUNTER — Other Ambulatory Visit: Payer: Self-pay

## 2019-05-17 ENCOUNTER — Encounter: Payer: Self-pay | Admitting: Family Medicine

## 2019-05-17 VITALS — BP 152/96 | HR 98 | Ht 70.0 in | Wt 220.0 lb

## 2019-05-17 DIAGNOSIS — I10 Essential (primary) hypertension: Secondary | ICD-10-CM | POA: Diagnosis not present

## 2019-05-17 DIAGNOSIS — E785 Hyperlipidemia, unspecified: Secondary | ICD-10-CM

## 2019-05-17 DIAGNOSIS — E1169 Type 2 diabetes mellitus with other specified complication: Secondary | ICD-10-CM

## 2019-05-17 DIAGNOSIS — H5789 Other specified disorders of eye and adnexa: Secondary | ICD-10-CM

## 2019-05-17 DIAGNOSIS — I480 Paroxysmal atrial fibrillation: Secondary | ICD-10-CM

## 2019-05-17 DIAGNOSIS — E119 Type 2 diabetes mellitus without complications: Secondary | ICD-10-CM

## 2019-05-17 MED ORDER — METFORMIN HCL 500 MG PO TABS: 500.0000 mg | ORAL_TABLET | Freq: Two times a day (BID) | ORAL | 1 refills | Status: DC

## 2019-05-17 MED ORDER — LISINOPRIL 5 MG PO TABS: 5.0000 mg | ORAL_TABLET | Freq: Every day | ORAL | 0 refills | Status: DC

## 2019-05-17 MED ORDER — POLYMYXIN B-TRIMETHOPRIM 10000-0.1 UNIT/ML-% OP SOLN: OPHTHALMIC | 0 refills | Status: DC

## 2019-05-17 MED ORDER — PANTOPRAZOLE SODIUM 40 MG PO TBEC: 40.0000 mg | DELAYED_RELEASE_TABLET | Freq: Every day | ORAL | 3 refills | Status: DC

## 2019-05-17 MED ORDER — ATORVASTATIN CALCIUM 20 MG PO TABS: 20.0000 mg | ORAL_TABLET | Freq: Every day | ORAL | 1 refills | Status: DC

## 2019-05-17 MED ORDER — JARDIANCE 25 MG PO TABS: 25.0000 mg | ORAL_TABLET | Freq: Every day | ORAL | 1 refills | Status: DC

## 2019-05-17 MED ORDER — METOPROLOL SUCCINATE ER 25 MG PO TB24: 25.0000 mg | ORAL_TABLET | Freq: Every day | ORAL | 0 refills | Status: DC

## 2019-05-17 MED ORDER — APIXABAN 5 MG PO TABS: 5.0000 mg | ORAL_TABLET | Freq: Two times a day (BID) | ORAL | 1 refills | Status: DC

## 2019-05-17 NOTE — Progress Notes (Signed)
Checking into the process of how to arrange this for Pt. Will ask tomorrow.

## 2019-05-17 NOTE — Assessment & Plan Note (Signed)
-  Continue current medications.  Recommend continuing to follow with cardiology.  Needs updated labs, ordered.

## 2019-05-17 NOTE — Assessment & Plan Note (Signed)
-  Symptoms consistent with conjunctivitis will cover with polytrim.  Doubt acute angle glaucoma given no significant pain associated with this.  Referral placed to ophthalmology as he is due for updated diabetic eye exam as well.

## 2019-05-17 NOTE — Assessment & Plan Note (Signed)
Will come in for updated labs next week.

## 2019-05-17 NOTE — Telephone Encounter (Signed)
Please schedule for VV today or tomorrow.  Thanks!

## 2019-05-17 NOTE — Progress Notes (Signed)
Roger Fisher - 53 y.o. male MRN [id number]  Date of birth: 02/28/1966   This visit type was conducted due to national recommendations for restrictions regarding the COVID-19 Pandemic (e.g. social distancing).  This format is felt to be most appropriate for this patient at this time.  All issues noted in this document were discussed and addressed.  No physical exam was performed (except for noted visual exam findings with Video Visits).  I discussed the limitations of evaluation and management by telemedicine and the availability of in person appointments. The patient expressed understanding and agreed to proceed.  I connected with@ on 05/17/19 at 11:00 AM EDT by a video enabled telemedicine application and verified that I am speaking with the correct person using two identifiers.   Patient Location[place] [address] [place] [id number]   Provider location:   Home office  Chief Complaint  Patient presents with  . Medication Refill    Eliquis and pantoprazole refills?  . Eye Problem    pt is c/o eyes red,blurry--1 wk--referral to eye doctor?    HPI  Roger Fisher is a 53 y.o. male who presents via audio/video conferencing for a telehealth visit today.  He has complaint of red eye today.  He is also requesting refills on medications.   -Eye redness:  Noticed eye redness 2 days ago after waking up.  Had mild irritation and "stinging" sensation.  Mild blurred vision,  "like a film" over his eyes.  Hs had clear discharge.  Denies eye pain, throbbing, headache.  He has not tried anything for treatment .  He denies fever or congestion. He denies contact lens use.   -A.fib:  History of a.fib, followed by cardiology.  Rate controlled with toprol xl and anticoagulated with eliquis. Denies side effects including increased fatigue, chest pain, or bleeding.     ROS:  A comprehensive ROS was completed and negative except as noted per HPI  Past Medical History:  Diagnosis Date  . Arrhythmia    . Asthma   . Diabetes mellitus without complication (HCC)    type 2  . GERD (gastroesophageal reflux disease)   . Hyperlipidemia   . Hypertension   . PAF (paroxysmal atrial fibrillation) (HCC)     Past Surgical History:  Procedure Laterality Date  . APPENDECTOMY    . HERNIA REPAIR      Family History  Problem Relation Age of Onset  . Cancer Mother   . Alcohol abuse Father   . Early death Sister     Social History   Socioeconomic History  . Marital status: Married    Spouse name: Not on file  . Number of children: Not on file  . Years of education: Not on file  . Highest education level: Not on file  Occupational History  . Not on file  Social Needs  . Financial resource strain: Not on file  . Food insecurity    Worry: Not on file    Inability: Not on file  . Transportation needs    Medical: Not on file    Non-medical: Not on file  Tobacco Use  . Smoking status: Former Games developermoker  . Smokeless tobacco: Never Used  Substance and Sexual Activity  . Alcohol use: Not Currently    Frequency: Never  . Drug use: Never  . Sexual activity: Not on file  Lifestyle  . Physical activity    Days per week: Not on file    Minutes per session: Not on file  .  Stress: Not on file  Relationships  . Social Musicianconnections    Talks on phone: Not on file    Gets together: Not on file    Attends religious service: Not on file    Active member of club or organization: Not on file    Attends meetings of clubs or organizations: Not on file    Relationship status: Not on file  . Intimate partner violence    Fear of current or ex partner: Not on file    Emotionally abused: Not on file    Physically abused: Not on file    Forced sexual activity: Not on file  Other Topics Concern  . Not on file  Social History Narrative  . Not on file     Current Outpatient Medications:  .  apixaban (ELIQUIS) 5 MG TABS tablet, Take 1 tablet (5 mg total) by mouth 2 (two) times daily., Disp: 60  tablet, Rfl: 5 .  atorvastatin (LIPITOR) 20 MG tablet, Take 1 tablet (20 mg total) by mouth daily., Disp: 90 tablet, Rfl: 1 .  empagliflozin (JARDIANCE) 25 MG TABS tablet, Take 25 mg by mouth daily., Disp: 90 tablet, Rfl: 1 .  lisinopril (ZESTRIL) 5 MG tablet, Take 1 tablet (5 mg total) by mouth daily., Disp: 90 tablet, Rfl: 0 .  metFORMIN (GLUCOPHAGE) 500 MG tablet, Take 1 tablet (500 mg total) by mouth 2 (two) times daily with a meal., Disp: 180 tablet, Rfl: 1 .  pantoprazole (PROTONIX) 40 MG tablet, Take 1 tablet (40 mg total) by mouth daily., Disp: 30 tablet, Rfl: 3 .  sildenafil (VIAGRA) 100 MG tablet, Take 0.5-1 tablets (50-100 mg total) by mouth daily as needed for erectile dysfunction., Disp: 30 tablet, Rfl: 6 .  amoxicillin (AMOXIL) 500 MG capsule, Take 1 capsule (500 mg total) by mouth 3 (three) times daily. (Patient not taking: Reported on 05/17/2019), Disp: 30 capsule, Rfl: 0 .  metoprolol succinate (TOPROL-XL) 25 MG 24 hr tablet, TAKE 1 TABLET BY MOUTH ONCE DAILY (Patient not taking: Reported on 05/17/2019), Disp: 90 tablet, Rfl: 0 .  miconazole (ZEASORB-AF) 2 % powder, Apply topically as needed for itching. (Patient not taking: Reported on 05/17/2019), Disp: 70 g, Rfl: 1 .  traMADol (ULTRAM) 50 MG tablet, Take 1 tablet (50 mg total) by mouth every 8 (eight) hours as needed. (Patient not taking: Reported on 05/17/2019), Disp: 15 tablet, Rfl: 0  EXAM:  VITALS per patient if applicable: BP (!) 152/96 Comment: pt reprt  Pulse 98 Comment: pt reprt  Ht 5\' 10"  (1.778 m)   Wt 220 lb (99.8 kg) Comment: pt reprt  BMI 31.57 kg/m   GENERAL: alert, oriented, appears well and in no acute distress  HEENT: atraumatic, conjunttiva injected R>L, no obvious abnormalities on inspection of external nose and ears  NECK: normal movements of the head and neck  LUNGS: on inspection no signs of respiratory distress, breathing rate appears normal, no obvious gross SOB, gasping or wheezing  CV: no obvious  cyanosis  MS: moves all visible extremities without noticeable abnormality  PSYCH/NEURO: pleasant and cooperative, no obvious depression or anxiety, speech and thought processing grossly intact  ASSESSMENT AND PLAN:  Discussed the following assessment and plan:  Paroxysmal atrial fibrillation (HCC) -Continue current medications.  Recommend continuing to follow with cardiology.  Needs updated labs, ordered.   Type 2 diabetes mellitus without complication, without long-term current use of insulin (HCC) Will come in for updated labs next week.   Eye redness -Symptoms consistent with  conjunctivitis will cover with polytrim.  Doubt acute angle glaucoma given no significant pain associated with this.  Referral placed to ophthalmology as he is due for updated diabetic eye exam as well.        I discussed the assessment and treatment plan with the patient. The patient was provided an opportunity to ask questions and all were answered. The patient agreed with the plan and demonstrated an understanding of the instructions.   The patient was advised to call back or seek an in-person evaluation if the symptoms worsen or if the condition fails to improve as anticipated.    Luetta Nutting, DO

## 2019-05-18 ENCOUNTER — Telehealth: Payer: Self-pay

## 2019-05-18 NOTE — Telephone Encounter (Signed)
Called Pt to notify of lab orders are in and gave him the [place] location of [facility] on [address] , Website to make appt . Pt stated that he will call/schedule via Website. Changed all lab orders for [facility] to be able to have the orders for them to draw up. Cancelled other orders for location of lab .

## 2019-05-18 NOTE — Addendum Note (Signed)
Addended by: Gala Lewandowsky A on: 05/18/2019 12:00 PM   Modules accepted: Orders

## 2019-05-18 NOTE — Addendum Note (Signed)
Addended by: Gala Lewandowsky A on: 05/18/2019 11:48 AM   Modules accepted: Orders

## 2019-05-23 ENCOUNTER — Encounter: Payer: Self-pay | Admitting: Family Medicine

## 2019-06-04 IMAGING — MR MR SHOULDER*L* WO/W CM
7 of 8 series · 34 of 40 positions shown · IV contrast (multihance)
Comparison: Radiograph 05/02/2018

CLINICAL DATA: Palpable left shoulder mass for approximately 6
weeks.

EXAM:
MRI OF THE LEFT SHOULDER WITHOUT AND WITH CONTRAST
TECHNIQUE: Multiplanar, multisequence MR imaging of the left shoulder was
performed before and after the administration of intravenous
contrast.
CONTRAST:  20mL MULTIHANCE GADOBENATE DIMEGLUMINE 529 MG/ML IV SOLN

[Series 9: T2 fat-sat · axial · left · 3.0mm · 0.53mm/px · z∈[-100,+16]mm · 6 of 36 slices shown (1 of 2)]
[im 1/36]
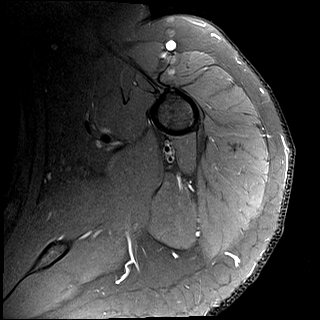
[im 8/36]
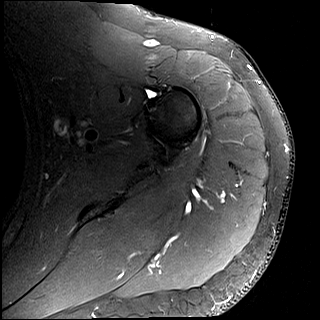
[im 15/36]
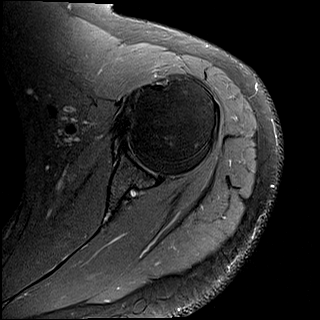
[im 22/36]
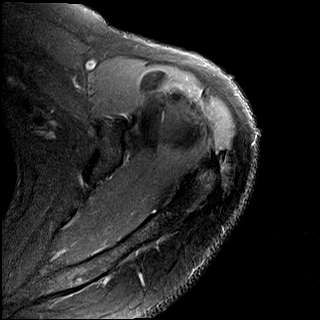
[im 29/36]
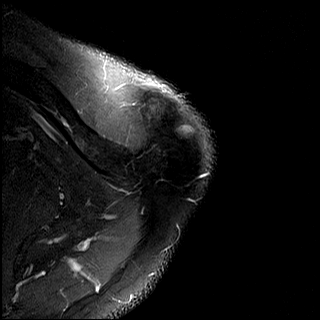
[im 36/36]
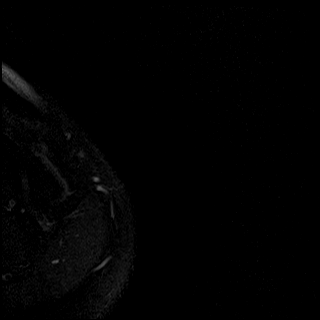

[Series 10: T1 · axial · left · 3.0mm · 0.53mm/px · z∈[-100,+16]mm · 5 of 36 slices shown (1 of 2)]
[im 1/36]
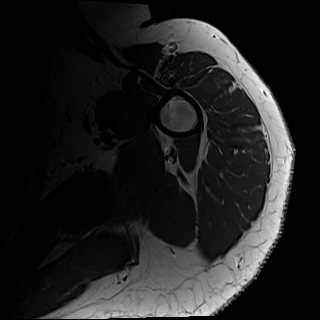
[im 9/36]
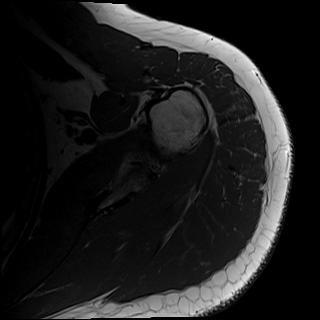
[im 18/36]
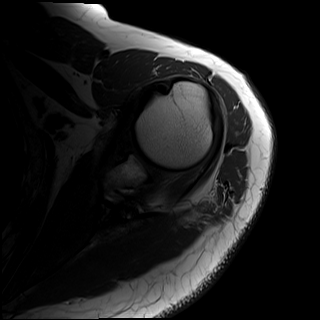
[im 27/36]
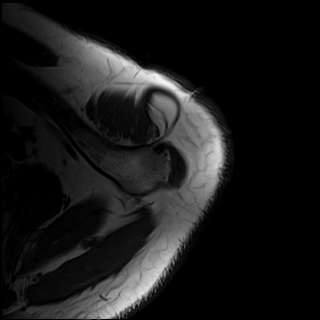
[im 36/36]
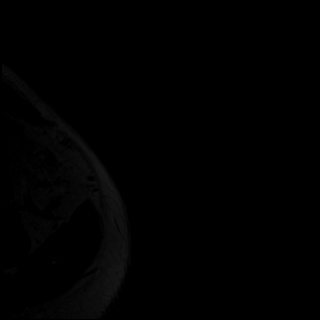

[Series 11: T1 fat-sat · axial · left · 3.0mm · 0.53mm/px · z∈[-100,+16]mm · 5 of 36 slices shown]
[im 1/36]
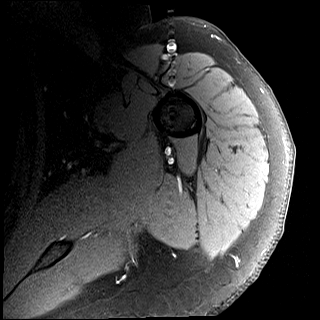
[im 9/36]
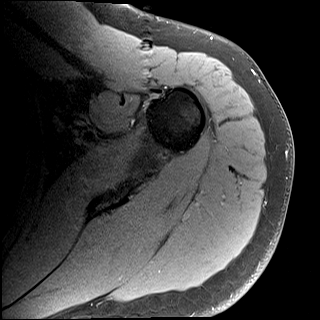
[im 18/36]
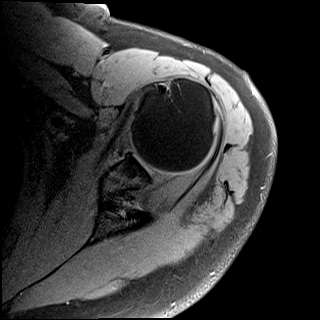
[im 27/36]
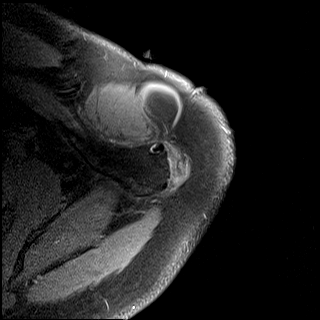
[im 36/36]
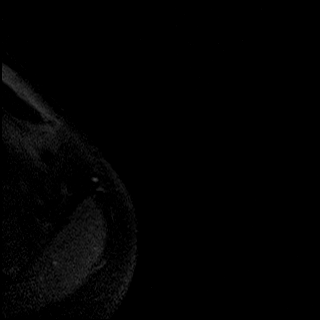

[Series 12: T1 · oblique · left · 3.0mm · 0.38mm/px · 5 of 35 slices shown (2 of 2)]
[im 1/35]
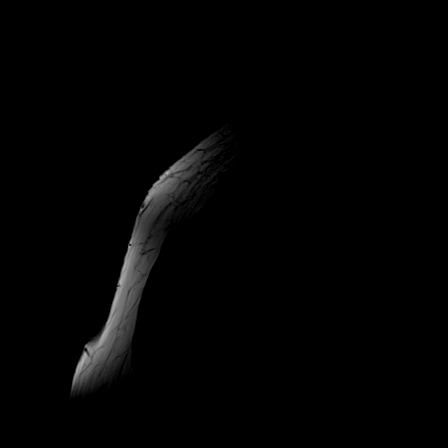
[im 9/35]
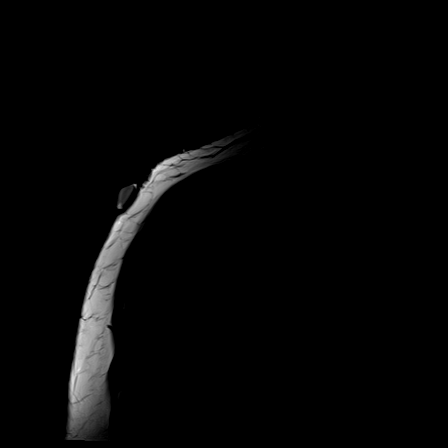
[im 18/35]
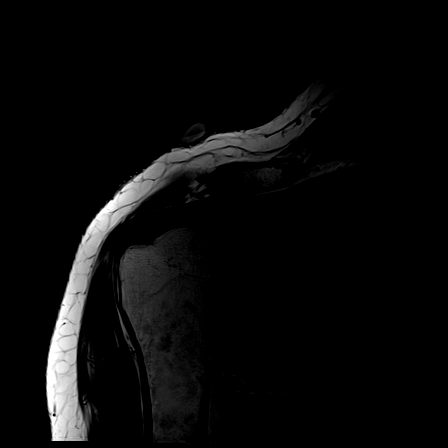
[im 26/35]
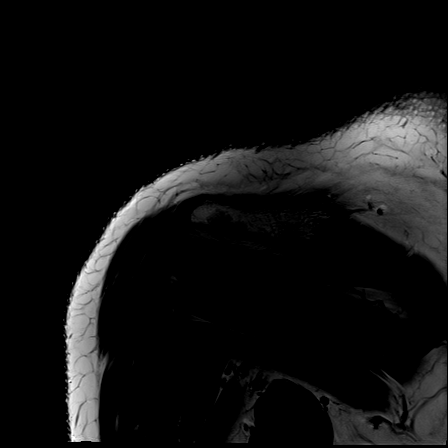
[im 35/35]
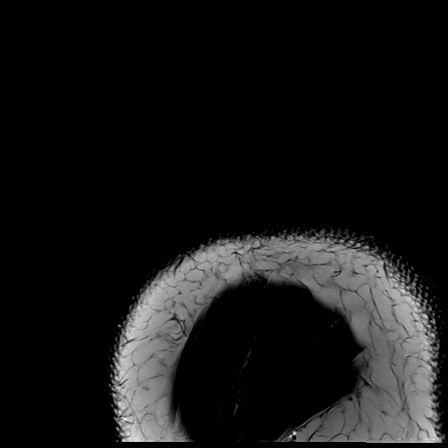

[Series 14: T2 fat-sat · oblique · left · 3.0mm · 0.38mm/px · 5 of 35 slices shown (2 of 2)]
[im 1/35]
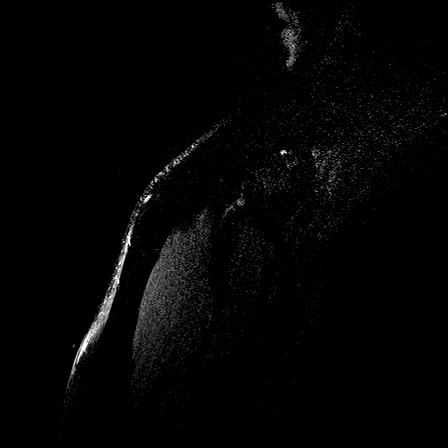
[im 9/35]
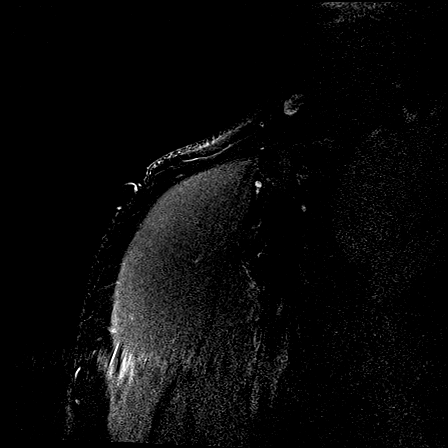
[im 18/35]
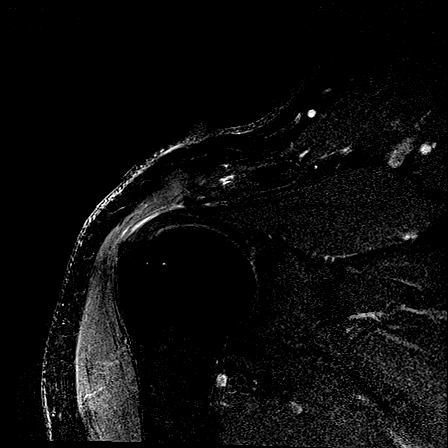
[im 26/35]
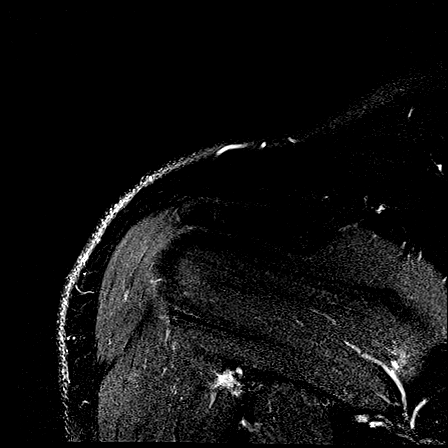
[im 35/35]
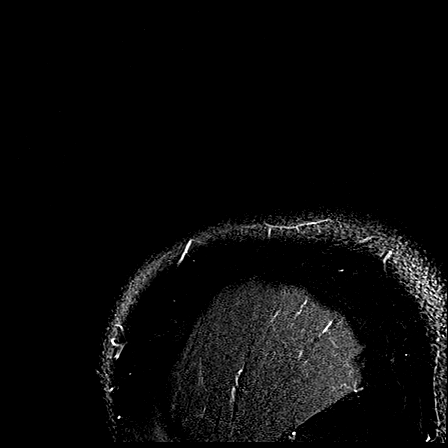

[Series 15: T1 fat-sat post-contrast · coronal · left · 3.0mm · 0.38mm/px · 5 of 31 slices shown (1 of 2)]
[im 1/31]
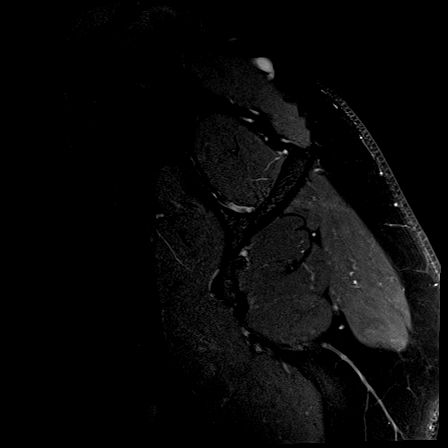
[im 8/31]
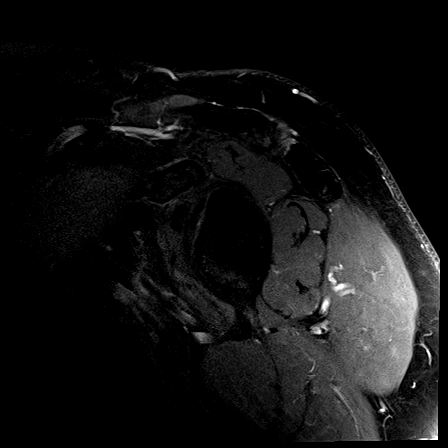
[im 16/31]
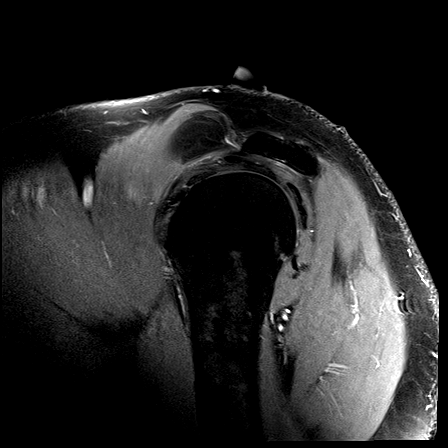
[im 23/31]
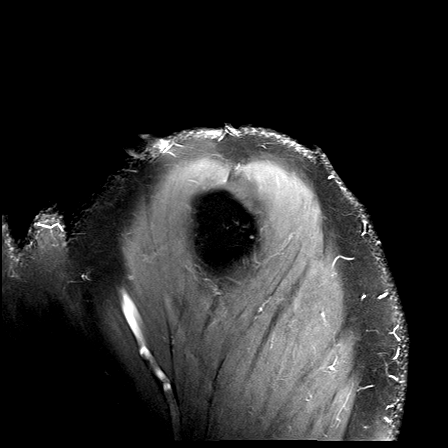
[im 31/31]
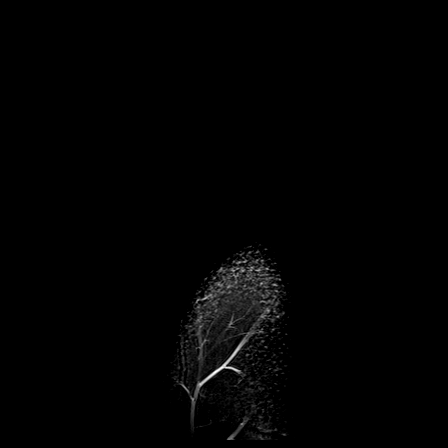

[Series 16: T1 fat-sat post-contrast · axial · left · 3.0mm · 0.53mm/px · z∈[-100,-44]mm · 3 of 36 slices shown (2 of 2)]
[im 1/36]
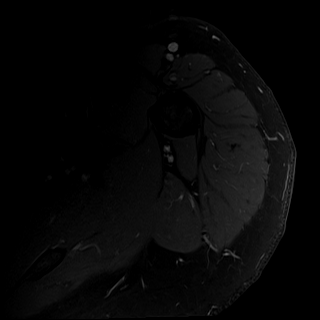
[im 9/36]
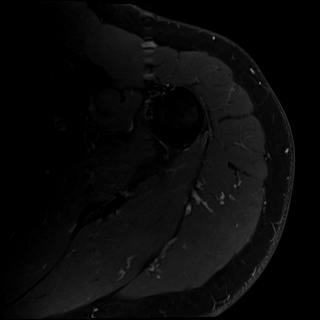
[im 18/36]
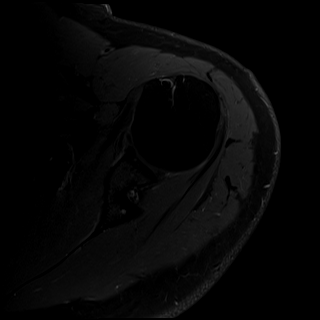

[34 of 40 positions shown; findings below may reference images not displayed]

FINDINGS: Rotator cuff: Mild rotator cuff tendinopathy/tendinosis. No rotator
cuff tear.

Muscles:  Normal

Biceps long head:  Intact

Acromioclavicular Joint: Mild degenerative changes type 2 acromion.
No lateral downsloping or undersurface spurring.

Glenohumeral Joint: No degenerative changes or joint effusion.

Labrum:  Grossly normal.

Bones:  No acute bony findings.  No bone lesions.

Other: The patient's palpable mass is marked with vitamin-E
capsules. Measures approximately 3.1 x 2.4 x 1.9 cm. It is fairly
homogeneous fat signal intensity. There are few septations and a few
vessels but no worrisome nodularity or enhancement after contrast
administration. It is located between the anterior and lateral bowel
is of the deltoid muscle protruding cephalad but surrounded by the
muscular fascia.
IMPRESSION: 1. The patient's palpable abnormality corresponds to a 3 cm
intramuscular lipoma associated with the deltoid muscle. No
worrisome MR imaging features. Recommend clinical surveillance and
reimaging only if this enlarges or changes on physical examination
(becomes firm, fixed, etc).
2. Mild rotator cuff tendinopathy/tendinosis.
3. Intact long head biceps tendon and glenoid labrum.

## 2019-06-25 ENCOUNTER — Other Ambulatory Visit: Payer: Self-pay | Admitting: Family Medicine

## 2019-06-25 DIAGNOSIS — I10 Essential (primary) hypertension: Secondary | ICD-10-CM

## 2019-06-28 ENCOUNTER — Encounter: Payer: Self-pay | Admitting: Family Medicine

## 2019-06-28 DIAGNOSIS — E1169 Type 2 diabetes mellitus with other specified complication: Secondary | ICD-10-CM

## 2019-06-28 DIAGNOSIS — E785 Hyperlipidemia, unspecified: Secondary | ICD-10-CM

## 2019-06-28 DIAGNOSIS — I10 Essential (primary) hypertension: Secondary | ICD-10-CM

## 2019-06-28 MED ORDER — LISINOPRIL 5 MG PO TABS: 5.0000 mg | ORAL_TABLET | Freq: Every day | ORAL | 1 refills | Status: DC

## 2019-06-28 MED ORDER — ATORVASTATIN CALCIUM 20 MG PO TABS: 20.0000 mg | ORAL_TABLET | Freq: Every day | ORAL | 1 refills | Status: DC

## 2019-07-11 ENCOUNTER — Encounter: Payer: Self-pay | Admitting: Family Medicine

## 2019-07-12 NOTE — Telephone Encounter (Signed)
Recommend flonase daily.

## 2019-08-03 ENCOUNTER — Telehealth (INDEPENDENT_AMBULATORY_CARE_PROVIDER_SITE_OTHER): Payer: BLUE CROSS/BLUE SHIELD | Admitting: Family Medicine

## 2019-08-03 ENCOUNTER — Encounter: Payer: Self-pay | Admitting: Family Medicine

## 2019-08-03 ENCOUNTER — Other Ambulatory Visit: Payer: Self-pay

## 2019-08-03 VITALS — BP 164/107 | HR 90

## 2019-08-03 DIAGNOSIS — I10 Essential (primary) hypertension: Secondary | ICD-10-CM

## 2019-08-03 DIAGNOSIS — R1011 Right upper quadrant pain: Secondary | ICD-10-CM

## 2019-08-03 MED ORDER — SUCRALFATE 1 G PO TABS: 1.0000 g | ORAL_TABLET | Freq: Three times a day (TID) | ORAL | 0 refills | Status: DC

## 2019-08-03 NOTE — Assessment & Plan Note (Signed)
Blood pressure noted to be elevated today.  Has not taken medication due to nausea.  Recommend that he be sure to take his blood pressure medication.

## 2019-08-03 NOTE — Progress Notes (Signed)
Roger Fisher - 53 y.o. male MRN [id number]  Date of birth: 10/16/1966   This visit type was conducted due to national recommendations for restrictions regarding the COVID-19 Pandemic (e.g. social distancing).  This format is felt to be most appropriate for this patient at this time.  All issues noted in this document were discussed and addressed.  No physical exam was performed (except for noted visual exam findings with Video Visits).  I discussed the limitations of evaluation and management by telemedicine and the availability of in person appointments. The patient expressed understanding and agreed to proceed.  I connected with@ on 08/03/19 at  2:40 PM EDT by a video enabled telemedicine application and verified that I am speaking with the correct person using two identifiers.   Patient Location: Home [address] [place]  [address]   Provider location:   [facility]  Chief Complaint  Patient presents with  . Abdominal Pain    x 3 days, had some nausea Denies vomitting, diarrhea. x 3 days ago brought Shrimp fron [employer] x 3 days ago unsure if it is related     HPI  Roger Fisher is a 53 y.o. male who presents via audio/video conferencing for a telehealth visit today.  He has complaint of abdominal pain that started 3 days ago.  Pain is located in the upper abdomen and right upper quadrant area.  He has had similar episodes of pain in the past that have been relieved with PPI.  He does report some mild nausea associated with this but denies any diarrhea.  He has not noticed any blood in his stool.  He reports that he had some shrimp a few days ago that he purchased from [employer] but this is the only thing out of the ordinary that he has eaten.  He has not had any associated fever or chills.  He is no longer taking Protonix that was prescribed to him previously.  ROS:  A comprehensive ROS was completed and negative except as noted per HPI  Past Medical History:  Diagnosis  Date  . Arrhythmia   . Asthma   . Diabetes mellitus without complication ([facility])    type 2  . GERD (gastroesophageal reflux disease)   . Hyperlipidemia   . Hypertension   . PAF (paroxysmal atrial fibrillation) ([facility])     Past Surgical History:  Procedure Laterality Date  . APPENDECTOMY    . HERNIA REPAIR      Family History  Problem Relation Age of Onset  . Cancer Mother   . Alcohol abuse Father   . Early death Sister     Social History   Socioeconomic History  . Marital status: Married    Spouse name: Not on file  . Number of children: Not on file  . Years of education: Not on file  . Highest education level: Not on file  Occupational History  . Not on file  Social Needs  . Financial resource strain: Not on file  . Food insecurity    Worry: Not on file    Inability: Not on file  . Transportation needs    Medical: Not on file    Non-medical: Not on file  Tobacco Use  . Smoking status: Former Research scientist (life sciences)  . Smokeless tobacco: Never Used  Substance and Sexual Activity  . Alcohol use: Not Currently    Frequency: Never  . Drug use: Never  . Sexual activity: Not on file  Lifestyle  . Physical activity  Days per week: Not on file    Minutes per session: Not on file  . Stress: Not on file  Relationships  . Social Musician on phone: Not on file    Gets together: Not on file    Attends religious service: Not on file    Active member of club or organization: Not on file    Attends meetings of clubs or organizations: Not on file    Relationship status: Not on file  . Intimate partner violence    Fear of current or ex partner: Not on file    Emotionally abused: Not on file    Physically abused: Not on file    Forced sexual activity: Not on file  Other Topics Concern  . Not on file  Social History Narrative  . Not on file     Current Outpatient Medications:  .  apixaban (ELIQUIS) 5 MG TABS tablet, Take 1 tablet (5 mg total) by mouth 2 (two) times  daily., Disp: 60 tablet, Rfl: 1 .  atorvastatin (LIPITOR) 20 MG tablet, Take 1 tablet (20 mg total) by mouth daily., Disp: 90 tablet, Rfl: 1 .  empagliflozin (JARDIANCE) 25 MG TABS tablet, Take 25 mg by mouth daily., Disp: 90 tablet, Rfl: 1 .  lisinopril (ZESTRIL) 5 MG tablet, Take 1 tablet (5 mg total) by mouth daily., Disp: 90 tablet, Rfl: 1 .  metFORMIN (GLUCOPHAGE) 500 MG tablet, Take 1 tablet (500 mg total) by mouth 2 (two) times daily with a meal., Disp: 180 tablet, Rfl: 1 .  metoprolol succinate (TOPROL-XL) 25 MG 24 hr tablet, Take 1 tablet (25 mg total) by mouth daily., Disp: 90 tablet, Rfl: 0 .  pantoprazole (PROTONIX) 40 MG tablet, Take 1 tablet (40 mg total) by mouth daily., Disp: 30 tablet, Rfl: 3 .  sildenafil (VIAGRA) 100 MG tablet, Take 0.5-1 tablets (50-100 mg total) by mouth daily as needed for erectile dysfunction., Disp: 30 tablet, Rfl: 6 .  trimethoprim-polymyxin b (POLYTRIM) ophthalmic solution, Place one drop in affected eye every 3-4  hours, Disp: 10 mL, Rfl: 0 .  sucralfate (CARAFATE) 1 g tablet, Take 1 tablet (1 g total) by mouth 4 (four) times daily -  with meals and at bedtime for 15 days., Disp: 60 tablet, Rfl: 0  EXAM:  VITALS per patient if applicable: BP (!) 164/107   Pulse 90   GENERAL: alert, oriented, appears well and in no acute distress  HEENT: atraumatic, conjunttiva clear, no obvious abnormalities on inspection of external nose and ears  NECK: normal movements of the head and neck  LUNGS: on inspection no signs of respiratory distress, breathing rate appears normal, no obvious gross SOB, gasping or wheezing  CV: no obvious cyanosis  MS: moves all visible extremities without noticeable abnormality  PSYCH/NEURO: pleasant and cooperative, no obvious depression or anxiety, speech and thought processing grossly intact  ASSESSMENT AND PLAN:  Discussed the following assessment and plan:  Essential hypertension Blood pressure noted to be elevated  today.  Has not taken medication due to nausea.  Recommend that he be sure to take his blood pressure medication.  RUQ pain Right upper quadrant and epigastric pain. -Right upper quadrant ultrasound requested. -Recommend he restart Protonix, will add on Carafate as well.  He is not taking any NSAIDs. -Discussed that if ultrasound is normal will consider referral to GI for further evaluation of ongoing intermittent stomach pain.       I discussed the assessment and treatment plan  with the patient. The patient was provided an opportunity to ask questions and all were answered. The patient agreed with the plan and demonstrated an understanding of the instructions.   The patient was advised to call back or seek an in-person evaluation if the symptoms worsen or if the condition fails to improve as anticipated.   Everrett Coombeody Veleda Mun, DO

## 2019-08-03 NOTE — Assessment & Plan Note (Signed)
Right upper quadrant and epigastric pain. -Right upper quadrant ultrasound requested. -Recommend he restart Protonix, will add on Carafate as well.  He is not taking any NSAIDs. -Discussed that if ultrasound is normal will consider referral to GI for further evaluation of ongoing intermittent stomach pain.

## 2019-08-30 ENCOUNTER — Other Ambulatory Visit: Payer: Self-pay | Admitting: Family Medicine

## 2020-02-20 ENCOUNTER — Encounter: Payer: Self-pay | Admitting: Family Medicine

## 2020-02-20 ENCOUNTER — Ambulatory Visit (INDEPENDENT_AMBULATORY_CARE_PROVIDER_SITE_OTHER): Payer: 59 | Admitting: Family Medicine

## 2020-02-20 ENCOUNTER — Other Ambulatory Visit: Payer: Self-pay

## 2020-02-20 VITALS — BP 129/85 | HR 93 | Temp 98.1°F | Ht 70.0 in | Wt 196.0 lb

## 2020-02-20 DIAGNOSIS — E1169 Type 2 diabetes mellitus with other specified complication: Secondary | ICD-10-CM

## 2020-02-20 DIAGNOSIS — I10 Essential (primary) hypertension: Secondary | ICD-10-CM | POA: Diagnosis not present

## 2020-02-20 DIAGNOSIS — Z111 Encounter for screening for respiratory tuberculosis: Secondary | ICD-10-CM

## 2020-02-20 DIAGNOSIS — I480 Paroxysmal atrial fibrillation: Secondary | ICD-10-CM

## 2020-02-20 DIAGNOSIS — E119 Type 2 diabetes mellitus without complications: Secondary | ICD-10-CM

## 2020-02-20 DIAGNOSIS — R350 Frequency of micturition: Secondary | ICD-10-CM

## 2020-02-20 DIAGNOSIS — E782 Mixed hyperlipidemia: Secondary | ICD-10-CM

## 2020-02-20 DIAGNOSIS — Z1211 Encounter for screening for malignant neoplasm of colon: Secondary | ICD-10-CM

## 2020-02-20 DIAGNOSIS — E785 Hyperlipidemia, unspecified: Secondary | ICD-10-CM

## 2020-02-20 DIAGNOSIS — E1140 Type 2 diabetes mellitus with diabetic neuropathy, unspecified: Secondary | ICD-10-CM

## 2020-02-20 MED ORDER — ATORVASTATIN CALCIUM 20 MG PO TABS: 20.0000 mg | ORAL_TABLET | Freq: Every day | ORAL | 2 refills | Status: DC

## 2020-02-20 MED ORDER — FREESTYLE LIBRE 2 SENSOR MISC: 1.0000 | 1 refills | Status: DC

## 2020-02-20 MED ORDER — METFORMIN HCL 500 MG PO TABS: 500.0000 mg | ORAL_TABLET | Freq: Two times a day (BID) | ORAL | 2 refills | Status: DC

## 2020-02-20 MED ORDER — LISINOPRIL 5 MG PO TABS: 5.0000 mg | ORAL_TABLET | Freq: Every day | ORAL | 2 refills | Status: DC

## 2020-02-20 MED ORDER — PANTOPRAZOLE SODIUM 40 MG PO TBEC: 40.0000 mg | DELAYED_RELEASE_TABLET | Freq: Every day | ORAL | 2 refills | Status: DC

## 2020-02-20 MED ORDER — JARDIANCE 25 MG PO TABS: 25.0000 mg | ORAL_TABLET | Freq: Every day | ORAL | 2 refills | Status: DC

## 2020-02-20 MED ORDER — APIXABAN 5 MG PO TABS: 5.0000 mg | ORAL_TABLET | Freq: Two times a day (BID) | ORAL | 5 refills | Status: DC

## 2020-02-20 MED ORDER — METOPROLOL SUCCINATE ER 25 MG PO TB24: 25.0000 mg | ORAL_TABLET | Freq: Every day | ORAL | 2 refills | Status: DC

## 2020-02-20 MED ORDER — FREESTYLE LIBRE 2 READER DEVI: 1.0000 | Freq: Once | 0 refills | Status: AC

## 2020-02-20 NOTE — Progress Notes (Addendum)
Roger Fisher - 54 y.o. male MRN [id number]  Date of birth: [birth year]  Subjective Chief Complaint  Patient presents with  . Hypertension  . Diabetes    HPI Roger Fisher is a 54 y.o. male with history of HTN, T2DM, PAF, and HLD here today for follow up visit.  I saw him previously however he had established with [facility] practice since last time I saw him.  -HTN/PAF:  Current management with chlorthalidone and toprol-xl.  Previously on lisinopril but this was discontinued by another provider for some reason. He reports that he is doing well with current meds.  He has not followed up with his cardiologist in >1 year.  He remains on eliquis for anticoagulation denies bleeding or dark stools.  He has not had chest pain, increased palpitations, headache or vision changes.   -T2DM:  Managed with metformin and jardiance.  He is doing well with this but reports increased urination, especially at night.  He is not checking home blood sugars.   He is due for updated A1c today.  He is also due for updated eye exam.    -HLD:  Tolerating atorvastatin well.  He denies myalgias.   -Shoulder pain:  Having increased pain in L shoulder.  Previously seen by Dr. Raeford Razor with US showing soft tissue mass and MRI showing 3cm intramuscular lipoma within the deltoid.  He reports that this is becoming more painful.    He is due for colonoscopy, he has been referred previously but backed out.   No Known Allergies  Past Medical History:  Diagnosis Date  . Arrhythmia   . Asthma   . Diabetes mellitus without complication ([place])    type 2  . GERD (gastroesophageal reflux disease)   . Hyperlipidemia   . Hypertension   . PAF (paroxysmal atrial fibrillation) ([place])     Past Surgical History:  Procedure Laterality Date  . APPENDECTOMY    . HERNIA REPAIR      Social History   Socioeconomic History  . Marital status: Married    Spouse name: Not on file  . Number of children: Not on file  . Years of  education: Not on file  . Highest education level: Not on file  Occupational History  . Not on file  Tobacco Use  . Smoking status: Former Research scientist (life sciences)  . Smokeless tobacco: Never Used  Substance and Sexual Activity  . Alcohol use: Not Currently  . Drug use: Never  . Sexual activity: Not on file  Other Topics Concern  . Not on file  Social History Narrative  . Not on file   Social Determinants of Health   Financial Resource Strain:   . Difficulty of Paying Living Expenses:   Food Insecurity:   . Worried About Charity fundraiser in the Last Year:   . Arboriculturist in the Last Year:   Transportation Needs:   . Film/video editor (Medical):   Marland Kitchen Lack of Transportation (Non-Medical):   Physical Activity:   . Days of Exercise per Week:   . Minutes of Exercise per Session:   Stress:   . Feeling of Stress :   Social Connections:   . Frequency of Communication with Friends and Family:   . Frequency of Social Gatherings with Friends and Family:   . Attends Religious Services:   . Active Member of Clubs or Organizations:   . Attends Archivist Meetings:   Marland Kitchen Marital Status:     Family History  Problem Relation Age of Onset  . Cancer Mother   . Alcohol abuse Father   . Early death Sister     Health Maintenance  Topic Date Due  . FOOT EXAM  Never done  . HIV Screening  Never done  . COVID-19 Vaccine (1) Never done  . TETANUS/TDAP  Never done  . COLONOSCOPY  Never done  . URINE MICROALBUMIN  04/13/2019  . HEMOGLOBIN A1C  04/24/2019  . OPHTHALMOLOGY EXAM  05/17/2019  . INFLUENZA VACCINE  06/01/2020  . PNEUMOCOCCAL POLYSACCHARIDE VACCINE AGE 49-64 HIGH RISK  Completed     ----------------------------------------------------------------------------------------------------------------------------------------------------------------------------------------------------------------- Physical Exam BP 129/85   Pulse 93   Temp 98.1 F (36.7 C) (Oral)   Ht 5\' 10"   (1.778 m)   Wt 196 lb (88.9 kg)   BMI 28.12 kg/m   Physical Exam Constitutional:      Appearance: Normal appearance.  HENT:     Head: Normocephalic and atraumatic.  Eyes:     General: No scleral icterus. Cardiovascular:     Rate and Rhythm: Normal rate and regular rhythm.  Pulmonary:     Effort: Pulmonary effort is normal.     Breath sounds: Normal breath sounds.  Skin:    General: Skin is warm and dry.  Neurological:     General: No focal deficit present.     Mental Status: He is alert.  Psychiatric:        Mood and Affect: Mood normal.     ------------------------------------------------------------------------------------------------------------------------------------------------------------------------------------------------------------------- Assessment and Plan  Paroxysmal atrial fibrillation (HCC) RRR on exam today. No symptoms recently.   He will continue toprol and eliquis.  Update labs.  Encouraged to f/u with cardiologist as well.   Essential hypertension BP is well controlled at this time however I recommend that we change his chlorthalidone back to lisinopril with his history of diabetes.  Updated rx sent in.   Type 2 diabetes mellitus with diabetic neuropathy, unspecified (HCC) He is having increased urination and weight is down since last visit which could indicate the diabetes control has worsened.  Rx for freestyle libre sent in for closer blood sugar monitoring.  Update a1c today.  Jardiance and metformin renewed for now.  Ophthalmology referral entered.   Hyperlipidemia associated with type 2 diabetes mellitus (HCC) Tolerating atorvastatin well, update lipid panel.   Will have him f/u with Dr. for shoulder pain.   Meds ordered this encounter  Medications  . Continuous Blood Gluc Receiver (FREESTYLE LIBRE 2 READER) DEVI    Sig: 1 Device by Does not apply route once for 1 dose. Use to check glucose daily.    Dispense:  1 each    Refill:  0   . Continuous Blood Gluc Sensor (FREESTYLE LIBRE 2 SENSOR) MISC    Sig: 1 Device by Does not apply route every 14 (fourteen) days.    Dispense:  6 each    Refill:  1  . lisinopril (ZESTRIL) 5 MG tablet    Sig: Take 1 tablet (5 mg total) by mouth daily.    Dispense:  90 tablet    Refill:  2  . apixaban (ELIQUIS) 5 MG TABS tablet    Sig: Take 1 tablet (5 mg total) by mouth 2 (two) times daily.    Dispense:  60 tablet    Refill:  5  . atorvastatin (LIPITOR) 20 MG tablet    Sig: Take 1 tablet (20 mg total) by mouth daily.    Dispense:  90 tablet  Refill:  2  . empagliflozin (JARDIANCE) 25 MG TABS tablet    Sig: Take 25 mg by mouth daily.    Dispense:  90 tablet    Refill:  2  . metFORMIN (GLUCOPHAGE) 500 MG tablet    Sig: Take 1 tablet (500 mg total) by mouth 2 (two) times daily with a meal.    Dispense:  180 tablet    Refill:  2  . metoprolol succinate (TOPROL-XL) 25 MG 24 hr tablet    Sig: Take 1 tablet (25 mg total) by mouth daily.    Dispense:  90 tablet    Refill:  2  . pantoprazole (PROTONIX) 40 MG tablet    Sig: Take 1 tablet (40 mg total) by mouth daily.    Dispense:  90 tablet    Refill:  2    Return in about 3 months (around 05/21/2020) for HTN/DM.    This visit occurred during the SARS-CoV-2 public health emergency.  Safety protocols were in place, including screening questions prior to the visit, additional usage of staff PPE, and extensive cleaning of exam room while observing appropriate contact time as indicated for disinfecting solutions.

## 2020-02-20 NOTE — Assessment & Plan Note (Signed)
Tolerating atorvastatin well, update lipid panel.  

## 2020-02-20 NOTE — Assessment & Plan Note (Addendum)
RRR on exam today. No symptoms recently.   He will continue toprol and eliquis.  Update labs.  Encouraged to f/u with cardiologist as well.

## 2020-02-20 NOTE — Assessment & Plan Note (Addendum)
He is having increased urination and weight is down since last visit which could indicate the diabetes control has worsened.  Rx for freestyle libre sent in for closer blood sugar monitoring.  Update a1c today.  Jardiance and metformin renewed for now.  Ophthalmology referral entered.

## 2020-02-20 NOTE — Patient Instructions (Addendum)
Great to see you today! Stop chlorthalidone Restart lisinopril 5mg  daily.  Use new glucose meter to check blood sugars.   Let know if you need help with using this.  Have labs completed. We'll be in touch with results.  I have entered referral for colonoscopy, you will be contacted for this.  See me again in 3 months.

## 2020-02-20 NOTE — Assessment & Plan Note (Signed)
BP is well controlled at this time however I recommend that we change his chlorthalidone back to lisinopril with his history of diabetes.  Updated rx sent in.

## 2020-02-21 LAB — COMPLETE METABOLIC PANEL WITH GFR
AG Ratio: 1.3 (calc) (ref 1.0–2.5)
ALT: 22 U/L (ref 9–46)
AST: 20 U/L (ref 10–35)
Albumin: 4.4 g/dL (ref 3.6–5.1)
Alkaline phosphatase (APISO): 89 U/L (ref 35–144)
BUN: 16 mg/dL (ref 7–25)
CO2: 30 mmol/L (ref 20–32)
Calcium: 10.3 mg/dL (ref 8.6–10.3)
Chloride: 93 mmol/L — ABNORMAL LOW (ref 98–110)
Creat: 1.04 mg/dL (ref 0.70–1.33)
GFR, Est African American: 95 mL/min/{1.73_m2} (ref >=60)
GFR, Est Non African American: 82 mL/min/{1.73_m2} (ref >=60)
Globulin: 3.3 g/dL (calc) (ref 1.9–3.7)
Glucose, Bld: 225 mg/dL — ABNORMAL HIGH (ref 65–99)
Potassium: 2.8 mmol/L — ABNORMAL LOW (ref 3.5–5.3)
Sodium: 138 mmol/L (ref 135–146)
Total Bilirubin: 0.5 mg/dL (ref 0.2–1.2)
Total Protein: 7.7 g/dL (ref 6.1–8.1)

## 2020-02-21 LAB — LIPID PANEL W/REFLEX DIRECT LDL
Cholesterol: 260 mg/dL — ABNORMAL HIGH (ref <200)
HDL: 34 mg/dL — ABNORMAL LOW (ref >=40)
LDL Cholesterol (Calc): 185 mg/dL (calc) — ABNORMAL HIGH
Non-HDL Cholesterol (Calc): 226 mg/dL (calc) — ABNORMAL HIGH (ref <130)
Total CHOL/HDL Ratio: 7.6 (calc) — ABNORMAL HIGH (ref <5.0)
Triglycerides: 223 mg/dL — ABNORMAL HIGH (ref <150)

## 2020-02-21 LAB — CBC
HCT: 44.3 % (ref 38.5–50.0)
Hemoglobin: 15.8 g/dL (ref 13.2–17.1)
MCH: 29.6 pg (ref 27.0–33.0)
MCHC: 35.7 g/dL (ref 32.0–36.0)
MCV: 83 fL (ref 80.0–100.0)
MPV: 11.8 fL (ref 7.5–12.5)
Platelets: 298 10*3/uL (ref 140–400)
RBC: 5.34 10*6/uL (ref 4.20–5.80)
RDW: 13.3 % (ref 11.0–15.0)
WBC: 7.4 10*3/uL (ref 3.8–10.8)

## 2020-02-21 LAB — PSA: PSA: 0.4 ng/mL (ref <=4.0)

## 2020-02-21 LAB — HEMOGLOBIN A1C: Hgb A1c MFr Bld: >14 % of total Hgb — ABNORMAL HIGH (ref <5.7)

## 2020-02-22 ENCOUNTER — Other Ambulatory Visit: Payer: Self-pay

## 2020-02-22 ENCOUNTER — Ambulatory Visit (INDEPENDENT_AMBULATORY_CARE_PROVIDER_SITE_OTHER): Payer: 59 | Admitting: Family Medicine

## 2020-02-22 ENCOUNTER — Encounter: Payer: Self-pay | Admitting: Family Medicine

## 2020-02-22 ENCOUNTER — Ambulatory Visit: Payer: 59

## 2020-02-22 DIAGNOSIS — E1140 Type 2 diabetes mellitus with diabetic neuropathy, unspecified: Secondary | ICD-10-CM

## 2020-02-22 DIAGNOSIS — E1169 Type 2 diabetes mellitus with other specified complication: Secondary | ICD-10-CM | POA: Diagnosis not present

## 2020-02-22 DIAGNOSIS — I10 Essential (primary) hypertension: Secondary | ICD-10-CM

## 2020-02-22 DIAGNOSIS — E785 Hyperlipidemia, unspecified: Secondary | ICD-10-CM | POA: Diagnosis not present

## 2020-02-22 LAB — TB SKIN TEST
Induration: 0 mm
TB Skin Test: NEGATIVE

## 2020-02-22 MED ORDER — PEN NEEDLES 31G X 6 MM MISC: 1 refills | Status: DC

## 2020-02-22 MED ORDER — POTASSIUM CHLORIDE CRYS ER 20 MEQ PO TBCR
40.0000 meq | EXTENDED_RELEASE_TABLET | Freq: Once | ORAL | 0 refills | Status: DC
Start: 2020-02-22 — End: 2020-05-12

## 2020-02-22 MED ORDER — ATORVASTATIN CALCIUM 80 MG PO TABS: 80.0000 mg | ORAL_TABLET | Freq: Every day | ORAL | 2 refills | Status: DC

## 2020-02-22 MED ORDER — LANTUS SOLOSTAR 100 UNIT/ML ~~LOC~~ SOPN
15.0000 [IU] | PEN_INJECTOR | Freq: Every day | SUBCUTANEOUS | 0 refills | Status: DC
Start: 2020-02-22 — End: 2020-03-21

## 2020-02-22 NOTE — Assessment & Plan Note (Signed)
Most recent A1c of  Lab Results  Component Value Date   HGBA1C >14.0 (H) 02/20/2020   indicates diabetes is not well controlled.  he  will continue metformin and jardiance.  Adding on lantus at 15 units today.  Instructed on lantus pen use.   Counseled on healthy, low carb diet and recommend frequent activity to help with maintaining good control of blood sugars.

## 2020-02-22 NOTE — Assessment & Plan Note (Signed)
Blood pressure is at goal at for age and co-morbidities.  I recommend he continue lisinopril and metoprolol.  Instructed to be sure to stop chlorthalidone.  Rx for kdur sent in. Take two tabs once.  In addition they were instructed to follow a low sodium diet with regular exercise to help to maintain adequate control of blood pressure.

## 2020-02-22 NOTE — Patient Instructions (Addendum)
Start lantus 15 units daily Increase atorvastatin to 80mg  daily.  Be sure to stop chlorthalidone.  See me again in one month.   Call or message with questions    Insulin Injection Instructions, Using Insulin Pens, Adult A subcutaneous injection is a shot of medicine that is injected into the layer of fat and tissue between skin and muscle. People with type 1 diabetes must take insulin because their bodies do not make it. People with type 2 diabetes may need to take insulin. There are many different types of insulin. The type of insulin that you take may determine how many injections you give yourself and when you need to give the injections. Supplies needed:  Soap and water to wash hands.  Your insulin pen.  A new, unused needle.  Alcohol wipes.  A disposal container that is meant for sharp items (sharps container), such as an empty plastic bottle with a cover. How to choose a site for injection The body absorbs insulin differently, depending on where the insulin is injected (injection site). It is best to inject insulin into the same body area each time (for example, always in the abdomen), but you should use a different spot in that area for each injection. Do not inject the insulin in the same spot each time. There are five main areas that can be used for injecting. These areas include:  Abdomen. This is the preferred area.  Front of thigh.  Upper, outer side of thigh.  Upper, outer side of arm.  Upper, outer part of buttock. How to use an insulin pen  First, follow the steps for Get ready, then continue with the steps for Inject the insulin. Get ready 1. Wash your hands with soap and water. If soap and water are not available, use hand sanitizer. 2. Before you give yourself an insulin injection, be sure to test your blood sugar level (blood glucose level) and write down that number. Follow any instructions from your health care provider about what to do if your blood  glucose level is higher or lower than your normal range. 3. Check the expiration date and the type of insulin that is in the pen. 4. If you are using CLEAR insulin, check to see that it is clear and free of clumps. 5. If you are using CLOUDY insulin, do not shake the pen to get the injection ready. Instead, get it ready in one of these ways: ? Gently roll the pen between your palms several times. ? Tip the pen up and down several times. 6. Remove the cap from the insulin pen. 7. Use an alcohol wipe to clean the rubber tip of the pen. 8. Remove the protective paper tab from the disposable needle. Do not let the needle touch anything. 9. Screw a new, unused needle onto the pen. 10. Remove the outer plastic needle cover. Do not throw away the outer plastic cover yet. ? If the pen uses a special safety needle, leave the inner needle shield in place. ? If the pen does not use a special safety needle, remove the inner plastic cover from the needle. 11. Follow the manufacturer's instructions to prime the insulin pen with the volume of insulin needed. Hold the pen with the needle pointing up, and push the button on the opposite end of the pen until a drop of insulin appears at the needle tip. If no insulin appears, repeat this step. 12. Turn the button (dial) to the number of units of insulin that  you will be injecting. Inject the insulin 1. Use an alcohol wipe to clean the site where you will be injecting the needle. Let the site air-dry. 2. Hold the pen in the palm of your writing hand like a pencil. 3. If directed by your health care provider, use your other hand to pinch and hold about an inch (2.5 cm) of skin at the injection site. Do not directly touch the cleaned part of the skin. 4. Gently but quickly, use your writing hand to put the needle straight into the skin. The needle should be at a 90-degree angle (perpendicular) to the skin. 5. When the needle is completely inserted into the skin, use  your thumb or index finger of your writing hand to push the top button of the pen down all the way to inject the insulin. 6. Let go of the skin that you are pinching. Continue to hold the pen in place with your writing hand. 7. Wait 10 seconds, then pull the needle straight out of the skin. This will allow all of the insulin to go from the pen and needle into your body. 8. Carefully put the larger (outer) plastic cover of the needle back over the needle, then unscrew the capped needle and discard it in a sharps container, such as an empty plastic bottle with a cover. 9. Put the plastic cap back on the insulin pen. How to throw away supplies  Discard all used needles in a puncture-proof sharps disposal container. You can ask your local pharmacy about where you can get this kind of disposal container, or you can use an empty plastic liquid laundry detergent bottle that has a cover.  Follow the disposal regulations for the area where you live. Do not use any needle more than one time.  Throw away empty disposable pens in the regular trash. Questions to ask your health care provider  How often should I be taking insulin?  How often should I check my blood glucose?  What amount of insulin should I be taking at each time?  What are the side effects?  What should I do if my blood glucose is too high?  What should I do if my blood glucose is too low?  What should I do if I forget to take my insulin?  What number should I call if I have questions? Where to find more information  American Diabetes Association (ADA): www.diabetes.org  American Association of Diabetes Educators (AADE) Patient Resources: https://www.diabeteseducator.org Summary  A subcutaneous injection is a shot of medicine that is injected into the layer of fat and tissue between skin and muscle.  Before you give yourself an insulin injection, be sure to test your blood sugar level (blood glucose level) and write down that  number.  Check the expiration date and the type of insulin that is in the pen. The type of insulin that you take may determine how many injections you give yourself and when you need to give the injections.  It is best to inject insulin into the same body area each time (for example, always in the abdomen), but you should use a different spot in that area for each injection. This information is not intended to replace advice given to you by your health care provider. Make sure you discuss any questions you have with your health care provider. Document Revised: 11/07/2017 Document Reviewed: 11/21/2015 Elsevier Patient Education  2020 [employer].

## 2020-02-22 NOTE — Progress Notes (Signed)
Roger Fisher - 54 y.o. male MRN [id number]  Date of birth: 1966-01-03  Subjective Chief Complaint  Patient presents with  . Follow-up    HPI Roger Fisher is a 54 y.o. male with history of HTN, T2DM, HLD, and A. Fib here today for follow up of abnormal labs.    -Diabetes:  Recent a1c >14.  He has had increased symptoms including increased thirst and urination.  He has also had some undesired weight loss.  He is currently treated with metformin and jardiance.   -HTN:  Chlorthalidone stopped at last visit.  Restarted on lisinopril.  Potassium low on recent labs at 2.8.    -HLD:  LDL elevate at 185.  He reports that he is taking lipitor 20mg .   ROS:  A comprehensive ROS was completed and negative except as noted per HPI   No Known Allergies  Past Medical History:  Diagnosis Date  . Arrhythmia   . Asthma   . Diabetes mellitus without complication (HCC)    type 2  . GERD (gastroesophageal reflux disease)   . Hyperlipidemia   . Hypertension   . PAF (paroxysmal atrial fibrillation) (HCC)     Past Surgical History:  Procedure Laterality Date  . APPENDECTOMY    . HERNIA REPAIR      Social History   Socioeconomic History  . Marital status: Married    Spouse name: Not on file  . Number of children: Not on file  . Years of education: Not on file  . Highest education level: Not on file  Occupational History  . Not on file  Tobacco Use  . Smoking status: Former  . Smokeless tobacco: Never Used  Substance and Sexual Activity  . Alcohol use: Not Currently  . Drug use: Never  . Sexual activity: Not on file  Other Topics Concern  . Not on file  Social History Narrative  . Not on file   Social Determinants of Health   Financial Resource Strain:   . Difficulty of Paying Living Expenses:   Food Insecurity:   . Worried About Games developer in the Last Year:   . Programme researcher, broadcasting/film/video in the Last Year:   Transportation Needs:   . Barista (Medical):   Freight forwarder  Lack of Transportation (Non-Medical):   Physical Activity:   . Days of Exercise per Week:   . Minutes of Exercise per Session:   Stress:   . Feeling of Stress :   Social Connections:   . Frequency of Communication with Friends and Family:   . Frequency of Social Gatherings with Friends and Family:   . Attends Religious Services:   . Active Member of Clubs or Organizations:   . Attends Marland Kitchen Meetings:   Banker Marital Status:     Family History  Problem Relation Age of Onset  . Cancer Mother   . Alcohol abuse Father   . Early death Sister     Health Maintenance  Topic Date Due  . FOOT EXAM  Never done  . COLONOSCOPY  Never done  . OPHTHALMOLOGY EXAM  05/17/2019  . COVID-19 Vaccine (1) 10/01/2020 (Originally 02/07/1983)  . TETANUS/TDAP  02/19/2021 (Originally 02/06/1986)  . INFLUENZA VACCINE  06/01/2020  . HEMOGLOBIN A1C  08/21/2020  . PNEUMOCOCCAL POLYSACCHARIDE VACCINE AGE 22-64 HIGH RISK  Completed  . HIV Screening  Discontinued     ----------------------------------------------------------------------------------------------------------------------------------------------------------------------------------------------------------------- Physical Exam BP 128/88   Pulse (!) 102   Ht 5\' 10"  (1.778  m)   Wt 197 lb (89.4 kg)   BMI 28.27 kg/m   Physical Exam Constitutional:      Appearance: Normal appearance.  HENT:     Head: Normocephalic and atraumatic.     Mouth/Throat:     Mouth: Mucous membranes are moist.  Eyes:     General: No scleral icterus. Cardiovascular:     Rate and Rhythm: Normal rate and regular rhythm.  Pulmonary:     Effort: Pulmonary effort is normal.     Breath sounds: Normal breath sounds.  Skin:    General: Skin is warm and dry.  Neurological:     General: No focal deficit present.     Mental Status: He is alert.  Psychiatric:        Mood and Affect: Mood normal.        Behavior: Behavior normal.      ------------------------------------------------------------------------------------------------------------------------------------------------------------------------------------------------------------------- Assessment and Plan  Essential hypertension Blood pressure is at goal at for age and co-morbidities.  I recommend he continue lisinopril and metoprolol.  Instructed to be sure to stop chlorthalidone.  Rx for kdur 53meq sent in. Take two tabs once.  In addition they were instructed to follow a low sodium diet with regular exercise to help to maintain adequate control of blood pressure.    Type 2 diabetes mellitus with diabetic neuropathy, unspecified ([place]) Most recent A1c of  Lab Results  Component Value Date   HGBA1C >14.0 (H) 02/20/2020   indicates diabetes is not well controlled.  he  will continue metformin and jardiance.  Adding on lantus at 15 units today.  Instructed on lantus pen use.   Counseled on healthy, low carb diet and recommend frequent activity to help with maintaining good control of blood sugars.    Hyperlipidemia associated with type 2 diabetes mellitus ([place]) Lab Results  Component Value Date   LDLCALC 185 (H) 02/20/2020  Current ASCVD risk >20%.  Increase lipitor to 80mg  daily. Recheck lipids in 6 months.    Meds ordered this encounter  Medications  . insulin glargine (LANTUS SOLOSTAR) 100 UNIT/ML Solostar Pen    Sig: Inject 15 Units into the skin daily.    Dispense:  15 mL    Refill:  0  . Insulin Pen Needle (PEN NEEDLES) 31G X 6 MM MISC    Sig: Use to inject insulin daily.    Dispense:  100 each    Refill:  1  . atorvastatin (LIPITOR) 80 MG tablet    Sig: Take 1 tablet (80 mg total) by mouth daily.    Dispense:  90 tablet    Refill:  2  . potassium chloride SA (KLOR-CON) 20 MEQ tablet    Sig: Take 2 tablets (40 mEq total) by mouth once for 1 dose.    Dispense:  2 tablet    Refill:  0    Return in about 4 weeks (around 03/21/2020) for  DM.    This visit occurred during the SARS-CoV-2 public health emergency.  Safety protocols were in place, including screening questions prior to the visit, additional usage of staff PPE, and extensive cleaning of exam room while observing appropriate contact time as indicated for disinfecting solutions.

## 2020-02-22 NOTE — Assessment & Plan Note (Signed)
Lab Results  Component Value Date   LDLCALC 185 (H) 02/20/2020  Current ASCVD risk >20%.  Increase lipitor to 80mg  daily. Recheck lipids in 6 months.

## 2020-02-27 ENCOUNTER — Ambulatory Visit: Payer: 59 | Admitting: Sports Medicine

## 2020-03-15 ENCOUNTER — Other Ambulatory Visit: Payer: Self-pay | Admitting: Family Medicine

## 2020-03-17 ENCOUNTER — Other Ambulatory Visit: Payer: Self-pay

## 2020-03-17 ENCOUNTER — Other Ambulatory Visit: Payer: Self-pay | Admitting: Family Medicine

## 2020-03-17 ENCOUNTER — Encounter: Payer: Self-pay | Admitting: Family Medicine

## 2020-03-17 MED ORDER — SILDENAFIL CITRATE 100 MG PO TABS: 50.0000 mg | ORAL_TABLET | Freq: Every day | ORAL | 6 refills | Status: DC | PRN

## 2020-03-17 NOTE — Telephone Encounter (Signed)
Please see message and advise.  Thank you. ° °

## 2020-03-21 ENCOUNTER — Encounter: Payer: Self-pay | Admitting: Family Medicine

## 2020-03-21 ENCOUNTER — Ambulatory Visit (INDEPENDENT_AMBULATORY_CARE_PROVIDER_SITE_OTHER): Payer: 59 | Admitting: Family Medicine

## 2020-03-21 VITALS — BP 143/83 | HR 98 | Temp 98.0°F | Ht 70.08 in | Wt 207.2 lb

## 2020-03-21 DIAGNOSIS — I10 Essential (primary) hypertension: Secondary | ICD-10-CM

## 2020-03-21 DIAGNOSIS — E1140 Type 2 diabetes mellitus with diabetic neuropathy, unspecified: Secondary | ICD-10-CM | POA: Diagnosis not present

## 2020-03-21 DIAGNOSIS — H40053 Ocular hypertension, bilateral: Secondary | ICD-10-CM | POA: Diagnosis not present

## 2020-03-21 MED ORDER — LANTUS SOLOSTAR 100 UNIT/ML ~~LOC~~ SOPN: 25.0000 [IU] | PEN_INJECTOR | Freq: Every day | SUBCUTANEOUS | 1 refills | Status: DC

## 2020-03-21 MED ORDER — FREESTYLE TEST VI STRP
ORAL_STRIP | 12 refills | Status: DC
Start: 2020-03-21 — End: 2021-03-02

## 2020-03-21 NOTE — Assessment & Plan Note (Signed)
Blood pressure is stable.  I recommend he continue current medicaiton.  In addition they were instructed to follow a low sodium diet with regular exercise to help to maintain adequate control of blood pressure.

## 2020-03-21 NOTE — Patient Instructions (Signed)
Great to see you! Increase lantus to 25 units daily.  Continue to monitor blood sugars.  If the freestyle sensor is too much, let me know. See me again in 8 weeks and we'll update a1c at that time.

## 2020-03-21 NOTE — Progress Notes (Signed)
Roger Fisher - 54 y.o. male MRN [id number]  Date of birth: [birth year]  Subjective Chief Complaint  Patient presents with  . Diabetes    HPI Roger Fisher is a 54 y.o. male with history of HTN, T2DM, PAF, and HLD here today for follow up.   -T2DM:  Recent a1c >14, was having symptoms of weight loss and increased thirst/urination.  He was continued on metformin and jardiance with addition of lantus 15 units daily.  Reports that blood sugars at home are around 250-270.  Using freestyle libre but sensors are too expensive.  No hypoglycemia.   -HTN:  Continues on lisinopril and metoprolol.  Doing well with current medications.  No side effects.  BP is pretty well controlled. Marland Kitchen  He denies chest pain, shortness of breath, palpitations, headache or vision changes.   ROS:  A comprehensive ROS was completed and negative except as noted per HPI    No Known Allergies  Past Medical History:  Diagnosis Date  . Arrhythmia   . Asthma   . Diabetes mellitus without complication (HCC)    type 2  . GERD (gastroesophageal reflux disease)   . Hyperlipidemia   . Hypertension   . PAF (paroxysmal atrial fibrillation) (HCC)     Past Surgical History:  Procedure Laterality Date  . APPENDECTOMY    . HERNIA REPAIR      Social History   Socioeconomic History  . Marital status: Married    Spouse name: Not on file  . Number of children: Not on file  . Years of education: Not on file  . Highest education level: Not on file  Occupational History  . Not on file  Tobacco Use  . Smoking status: Former Games developer  . Smokeless tobacco: Never Used  Substance and Sexual Activity  . Alcohol use: Not Currently  . Drug use: Never  . Sexual activity: Not on file  Other Topics Concern  . Not on file  Social History Narrative  . Not on file   Social Determinants of Health   Financial Resource Strain:   . Difficulty of Paying Living Expenses:   Food Insecurity:   . Worried About Programme researcher, broadcasting/film/video in  the Last Year:   . Barista in the Last Year:   Transportation Needs:   . Freight forwarder (Medical):   Marland Kitchen Lack of Transportation (Non-Medical):   Physical Activity:   . Days of Exercise per Week:   . Minutes of Exercise per Session:   Stress:   . Feeling of Stress :   Social Connections:   . Frequency of Communication with Friends and Family:   . Frequency of Social Gatherings with Friends and Family:   . Attends Religious Services:   . Active Member of Clubs or Organizations:   . Attends Banker Meetings:   Marland Kitchen Marital Status:     Family History  Problem Relation Age of Onset  . Cancer Mother   . Alcohol abuse Father   . Early death Sister     Health Maintenance  Topic Date Due  . FOOT EXAM  Never done  . COLONOSCOPY  Never done  . OPHTHALMOLOGY EXAM  05/17/2019  . COVID-19 Vaccine (1) 10/01/2020 (Originally 02/07/1979)  . TETANUS/TDAP  02/19/2021 (Originally 02/06/1986)  . INFLUENZA VACCINE  06/01/2020  . HEMOGLOBIN A1C  08/21/2020  . PNEUMOCOCCAL POLYSACCHARIDE VACCINE AGE 74-64 HIGH RISK  Completed  . HIV Screening  Discontinued     ----------------------------------------------------------------------------------------------------------------------------------------------------------------------------------------------------------------- Physical Exam  BP (!) 143/83 (BP Location: Left Arm, Patient Position: Sitting, Cuff Size: Large)   Pulse 98   Temp 98 F (36.7 C) (Temporal)   Ht 5' 10.08" (1.78 m)   Wt 207 lb 3.2 oz (94 kg)   SpO2 95%   BMI 29.66 kg/m   Physical Exam Constitutional:      Appearance: Normal appearance.  HENT:     Head: Normocephalic and atraumatic.  Cardiovascular:     Rate and Rhythm: Normal rate and regular rhythm.  Pulmonary:     Effort: Pulmonary effort is normal.     Breath sounds: Normal breath sounds.  Musculoskeletal:     Cervical back: Neck supple.  Neurological:     General: No focal deficit present.      Mental Status: He is alert.  Psychiatric:        Mood and Affect: Mood normal.        Behavior: Behavior normal.     ------------------------------------------------------------------------------------------------------------------------------------------------------------------------------------------------------------------- Assessment and Plan  Type 2 diabetes mellitus with diabetic neuropathy, unspecified ([place]) Blood sugars have improved some since last visit.  Continue to titrate lantus to 25 units.  Can consider addition of weekly GLP-1 at next visit as well if BP still not controlled.  Freestyle libre costs too much, will send strips back over.  New ophthalmology referral entered as insurance not accepted at facility referral sent to previously.    Essential hypertension Blood pressure is stable.  I recommend he continue current medicaiton.  In addition they were instructed to follow a low sodium diet with regular exercise to help to maintain adequate control of blood pressure.     Meds ordered this encounter  Medications  . glucose blood (FREESTYLE TEST STRIPS) test strip    Sig: Use as instructed    Dispense:  100 each    Refill:  12  . insulin glargine (LANTUS SOLOSTAR) 100 UNIT/ML Solostar Pen    Sig: Inject 25 Units into the skin daily.    Dispense:  22.5 mL    Refill:  1    Return in about 8 weeks (around 05/16/2020) for HTN/DM.    This visit occurred during the SARS-CoV-2 public health emergency.  Safety protocols were in place, including screening questions prior to the visit, additional usage of staff PPE, and extensive cleaning of exam room while observing appropriate contact time as indicated for disinfecting solutions.

## 2020-03-21 NOTE — Assessment & Plan Note (Addendum)
Blood sugars have improved some since last visit.  Continue to titrate lantus to 25 units.  Can consider addition of weekly GLP-1 at next visit as well if BP still not controlled.  Freestyle libre costs too much, will send strips back over.  New ophthalmology referral entered as insurance not accepted at facility referral sent to previously.

## 2020-03-25 ENCOUNTER — Encounter: Payer: Self-pay | Admitting: Family Medicine

## 2020-03-26 ENCOUNTER — Encounter: Payer: Self-pay | Admitting: Gastroenterology

## 2020-05-12 ENCOUNTER — Encounter: Payer: Self-pay | Admitting: Gastroenterology

## 2020-05-12 ENCOUNTER — Ambulatory Visit (INDEPENDENT_AMBULATORY_CARE_PROVIDER_SITE_OTHER): Payer: 59 | Admitting: Gastroenterology

## 2020-05-12 ENCOUNTER — Telehealth: Payer: Self-pay

## 2020-05-12 VITALS — BP 144/74 | HR 91 | Temp 98.4°F | Ht 71.0 in | Wt 218.4 lb

## 2020-05-12 DIAGNOSIS — Z1211 Encounter for screening for malignant neoplasm of colon: Secondary | ICD-10-CM

## 2020-05-12 NOTE — Patient Instructions (Addendum)
If you are age 53 or older, your body mass index should be between 23-30. Your Body mass index is 30.46 kg/m. If this is out of the aforementioned range listed, please consider follow up with your Primary Care Provider.  If you are age 24 or younger, your body mass index should be between 19-25. Your Body mass index is 30.46 kg/m. If this is out of the aformentioned range listed, please consider follow up with your Primary Care Provider.   You have been scheduled for a colonoscopy. Please follow written instructions given to you at your visit today.  Please pick up your prep supplies at the pharmacy within the next 1-3 days. If you use inhalers (even only as needed), please bring them with you on the day of your procedure. Your physician has requested that you go to www.startemmi.com and enter the access code given to you at your visit today. This web site gives a general overview about your procedure. However, you should still follow specific instructions given to you by our office regarding your preparation for the procedure.  You will be contacted by our office prior to your procedure for directions on holding your Eliquis.  If you do not hear from our office 1 week prior to your scheduled procedure, please call [phone] to discuss.   SEE DIABETIC MEDICATIONS INSTRUCTIONS.  Thank you for choosing me and [facility].   Lynann Bologna, MD
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
## 2020-05-12 NOTE — Telephone Encounter (Signed)
   [facility] [address], [address] [place], [place]  [address] Phone:  [phone]   Fax:  [phone]   05/12/2020   RE:      Abed Schar DOB:   December 11, 1965 MRN:   [id number]   Dear Dr. Ashley Royalty,    We have scheduled the above patient for an endoscopic procedure. Our records show that he is on anticoagulation therapy.   Please advise as to whether the patient may come off his therapy of Eliquis 24 hours prior to the colonoscopy procedure, which is scheduled for 06/30/20.  Please fax back/ or route form to [place], [place] at [phone].   Sincerely,    Priscella Mann, RMA

## 2020-05-15 NOTE — Telephone Encounter (Signed)
Mr. Gedney may discontinue his eliquis 24 hours prior to colonoscopy and resume the day following colonoscopy.    Thanks  CM

## 2020-05-21 ENCOUNTER — Ambulatory Visit: Payer: 59 | Admitting: Family Medicine

## 2020-05-21 NOTE — Telephone Encounter (Signed)
Spoke with patient today regarding holding Eliquis  24 hours prior to colonoscopy.  Per Dr. Ashley Royalty patient my hold and restart the day after procedure.  Patient verbalized understanding.

## 2020-05-22 ENCOUNTER — Encounter: Payer: Self-pay | Admitting: Family Medicine

## 2020-05-22 ENCOUNTER — Ambulatory Visit (INDEPENDENT_AMBULATORY_CARE_PROVIDER_SITE_OTHER): Payer: 59 | Admitting: Family Medicine

## 2020-05-22 VITALS — BP 154/88 | HR 89 | Temp 98.5°F | Wt 220.0 lb

## 2020-05-22 DIAGNOSIS — I10 Essential (primary) hypertension: Secondary | ICD-10-CM | POA: Diagnosis not present

## 2020-05-22 DIAGNOSIS — H539 Unspecified visual disturbance: Secondary | ICD-10-CM

## 2020-05-22 DIAGNOSIS — F5101 Primary insomnia: Secondary | ICD-10-CM

## 2020-05-22 DIAGNOSIS — I480 Paroxysmal atrial fibrillation: Secondary | ICD-10-CM | POA: Diagnosis not present

## 2020-05-22 DIAGNOSIS — E1140 Type 2 diabetes mellitus with diabetic neuropathy, unspecified: Secondary | ICD-10-CM

## 2020-05-22 DIAGNOSIS — H40053 Ocular hypertension, bilateral: Secondary | ICD-10-CM

## 2020-05-22 LAB — POCT GLYCOSYLATED HEMOGLOBIN (HGB A1C): Hemoglobin A1C: 8.5 % — AB (ref 4.0–5.6)

## 2020-05-22 MED ORDER — TRAZODONE HCL 50 MG PO TABS: 50.0000 mg | ORAL_TABLET | Freq: Every evening | ORAL | 3 refills | Status: DC | PRN

## 2020-05-22 MED ORDER — LISINOPRIL 10 MG PO TABS: 10.0000 mg | ORAL_TABLET | Freq: Every day | ORAL | 2 refills | Status: DC

## 2020-05-22 NOTE — Assessment & Plan Note (Signed)
Blood pressure is not at goal at for age and co-morbidities.  I recommend increasing lisinopril to 10mg  daily.  In addition they were instructed to follow a low sodium diet with regular exercise to help to maintain adequate control of blood pressure.

## 2020-05-22 NOTE — Assessment & Plan Note (Signed)
Most recent A1c of  Lab Results  Component Value Date   HGBA1C 8.5 (A) 05/22/2020   Diabetes control has improved.  He will continue lantus at current dosing as well as jardiance and metformin.  Counseled on healthy, low carb diet including elimination of sodas and recommend frequent activity to help with maintaining good control of blood sugars.

## 2020-05-22 NOTE — Assessment & Plan Note (Addendum)
Rate controlled with toprol xl, continue at current dose.  He is anticoagulated with eliquis, doing well at this time.  He will briefly discontinue eliquis for upcoming colonoscopy.

## 2020-05-22 NOTE — Assessment & Plan Note (Signed)
Has not had significant improvement with melatonin.  Will provide trial of trazodone.
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
## 2020-05-22 NOTE — Assessment & Plan Note (Signed)
Worsening vision changes, history of elevated eye pressures.  Referral made to ophthalmology.

## 2020-05-22 NOTE — Patient Instructions (Addendum)
Lab Results  Component Value Date   HGBA1C 8.5 (A) 05/22/2020  Continue to work on lifestyle change and reduction in sodas to improve blood sugars.   Let's try trazodone for sleep.  You can take 1-2 tablets as needed.   I have entered another referral for eye doctor.  Let us know if you don't hear anything over the next 7-10 days.   See me again in about 3 months.

## 2020-06-12 ENCOUNTER — Encounter: Payer: Self-pay | Admitting: Family Medicine

## 2020-06-30 ENCOUNTER — Encounter: Payer: 59 | Admitting: Gastroenterology

## 2020-08-22 ENCOUNTER — Encounter: Payer: Self-pay | Admitting: Family Medicine

## 2020-08-22 ENCOUNTER — Other Ambulatory Visit: Payer: Self-pay

## 2020-08-22 ENCOUNTER — Ambulatory Visit (INDEPENDENT_AMBULATORY_CARE_PROVIDER_SITE_OTHER): Payer: 59 | Admitting: Family Medicine

## 2020-08-22 VITALS — BP 143/88 | HR 86 | Temp 98.2°F | Wt 220.5 lb

## 2020-08-22 DIAGNOSIS — R400 Somnolence: Secondary | ICD-10-CM | POA: Insufficient documentation

## 2020-08-22 DIAGNOSIS — I10 Essential (primary) hypertension: Secondary | ICD-10-CM

## 2020-08-22 DIAGNOSIS — E1140 Type 2 diabetes mellitus with diabetic neuropathy, unspecified: Secondary | ICD-10-CM | POA: Diagnosis not present

## 2020-08-22 DIAGNOSIS — I480 Paroxysmal atrial fibrillation: Secondary | ICD-10-CM | POA: Diagnosis not present

## 2020-08-22 DIAGNOSIS — E119 Type 2 diabetes mellitus without complications: Secondary | ICD-10-CM

## 2020-08-22 DIAGNOSIS — E1169 Type 2 diabetes mellitus with other specified complication: Secondary | ICD-10-CM | POA: Diagnosis not present

## 2020-08-22 DIAGNOSIS — E785 Hyperlipidemia, unspecified: Secondary | ICD-10-CM

## 2020-08-22 LAB — POCT GLYCOSYLATED HEMOGLOBIN (HGB A1C): Hemoglobin A1C: 8.4 % — AB (ref 4.0–5.6)

## 2020-08-22 MED ORDER — LANTUS SOLOSTAR 100 UNIT/ML ~~LOC~~ SOPN: 25.0000 [IU] | PEN_INJECTOR | Freq: Every day | SUBCUTANEOUS | 1 refills | Status: DC

## 2020-08-22 MED ORDER — APIXABAN 5 MG PO TABS: 5.0000 mg | ORAL_TABLET | Freq: Two times a day (BID) | ORAL | 5 refills | Status: DC

## 2020-08-22 NOTE — Patient Instructions (Addendum)
Continue current medications.  Continue to work on reduction of carbohydrate intake I have ordered a referral for sleep study.  Please schedule your colonoscopy.  See me again in 3 months.

## 2020-08-22 NOTE — Assessment & Plan Note (Signed)
Rate controlled with metoprolol, denies any symptoms.  He remains on eliquis for anticoagulation.

## 2020-08-22 NOTE — Assessment & Plan Note (Signed)
Tolerating atorvastatin well, continue at current strength.   

## 2020-08-22 NOTE — Assessment & Plan Note (Signed)
Recommend sleep study with increased fatigue and history of PAF.

## 2020-08-22 NOTE — Assessment & Plan Note (Signed)
Most recent A1c of  Lab Results  Component Value Date   HGBA1C 8.4 (A) 08/22/2020   indicates diabetes is better controlled.  He will continue to work on diet and lifestyle change. .  Counseled on healthy, low carb diet and recommend frequent activity to help with maintaining good control of blood sugars.

## 2020-08-22 NOTE — Assessment & Plan Note (Signed)
Mild elevation of BP in clinic today.   Recommend low sodium diet and taking medications each day.
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
## 2020-10-13 ENCOUNTER — Other Ambulatory Visit: Payer: Self-pay

## 2020-10-13 DIAGNOSIS — E1140 Type 2 diabetes mellitus with diabetic neuropathy, unspecified: Secondary | ICD-10-CM

## 2020-10-13 MED ORDER — PEN NEEDLES 31G X 6 MM MISC: 1 refills | Status: DC

## 2020-11-24 ENCOUNTER — Ambulatory Visit: Payer: 59 | Admitting: Family Medicine

## 2020-12-02 ENCOUNTER — Other Ambulatory Visit: Payer: Self-pay

## 2020-12-02 ENCOUNTER — Ambulatory Visit (INDEPENDENT_AMBULATORY_CARE_PROVIDER_SITE_OTHER): Payer: BC Managed Care – PPO | Admitting: Family Medicine

## 2020-12-02 ENCOUNTER — Encounter: Payer: Self-pay | Admitting: Family Medicine

## 2020-12-02 VITALS — BP 171/83 | HR 105 | Temp 98.1°F | Wt 227.4 lb

## 2020-12-02 DIAGNOSIS — Z1211 Encounter for screening for malignant neoplasm of colon: Secondary | ICD-10-CM

## 2020-12-02 DIAGNOSIS — E785 Hyperlipidemia, unspecified: Secondary | ICD-10-CM

## 2020-12-02 DIAGNOSIS — E1140 Type 2 diabetes mellitus with diabetic neuropathy, unspecified: Secondary | ICD-10-CM

## 2020-12-02 DIAGNOSIS — I10 Essential (primary) hypertension: Secondary | ICD-10-CM

## 2020-12-02 DIAGNOSIS — I480 Paroxysmal atrial fibrillation: Secondary | ICD-10-CM

## 2020-12-02 DIAGNOSIS — E1169 Type 2 diabetes mellitus with other specified complication: Secondary | ICD-10-CM | POA: Diagnosis not present

## 2020-12-02 LAB — POCT GLYCOSYLATED HEMOGLOBIN (HGB A1C): Hemoglobin A1C: 8.2 % — AB (ref 4.0–5.6)

## 2020-12-02 MED ORDER — DICLOFENAC SODIUM 1 % EX GEL: 4.0000 g | Freq: Four times a day (QID) | CUTANEOUS | 3 refills | Status: DC | PRN

## 2020-12-02 MED ORDER — APIXABAN 5 MG PO TABS: 5.0000 mg | ORAL_TABLET | Freq: Two times a day (BID) | ORAL | 5 refills | Status: DC

## 2020-12-02 MED ORDER — LISINOPRIL 10 MG PO TABS: 10.0000 mg | ORAL_TABLET | Freq: Every day | ORAL | 2 refills | Status: DC

## 2020-12-02 MED ORDER — METOPROLOL SUCCINATE ER 25 MG PO TB24: 25.0000 mg | ORAL_TABLET | Freq: Every day | ORAL | 2 refills | Status: DC

## 2020-12-02 MED ORDER — ATORVASTATIN CALCIUM 80 MG PO TABS: 80.0000 mg | ORAL_TABLET | Freq: Every day | ORAL | 2 refills | Status: DC

## 2020-12-02 MED ORDER — OZEMPIC (0.25 OR 0.5 MG/DOSE) 2 MG/1.5ML ~~LOC~~ SOPN: PEN_INJECTOR | SUBCUTANEOUS | 3 refills | Status: DC

## 2020-12-02 MED ORDER — OZEMPIC (0.25 OR 0.5 MG/DOSE) 2 MG/1.5ML ~~LOC~~ SOPN: PEN_INJECTOR | SUBCUTANEOUS | 1 refills | Status: DC

## 2020-12-02 MED ORDER — FREESTYLE LIBRE 2 SENSOR MISC: 1.0000 | 1 refills | Status: DC

## 2020-12-02 MED ORDER — FREESTYLE LIBRE 2 READER DEVI
0 refills | Status: DC
Start: 2020-12-02 — End: 2021-12-17

## 2020-12-02 NOTE — Assessment & Plan Note (Signed)
Lab Results  Component Value Date   HGBA1C 8.2 (A) 12/02/2020  Worsening control blood sugars compared to last visit.  He has been pretty lax with his diet over the past few months.  Encouraged to get back on track with this.  We will add Ozempic to current medications to help with blood sugar control as well as weight management.

## 2020-12-02 NOTE — Assessment & Plan Note (Signed)
He is doing well with atorvastatin for management of hyperlipidemia.  Update lipid panel today.
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
## 2020-12-02 NOTE — Patient Instructions (Addendum)
Keep insulin (lantus) at 25 units Start Ozempic once weekly Be sure that you are taking lisinopril and metoprolol.  Return in about 4 weeks for nurse visit to re-check blood pressure.  Have colonoscopy completed.  See me again in 3 months.

## 2020-12-02 NOTE — Assessment & Plan Note (Signed)
He denies any symptoms at this time.  He will continue metoprolol for rate control.  He will continue eliquis for anticoagulation.  Update labs today.

## 2020-12-02 NOTE — Assessment & Plan Note (Signed)
Pressure elevated today however he has not been taking lisinopril or metoprolol regularly.  Recommend that he restart these medications, follow low-sodium diet and follow-up in a few weeks to recheck blood pressure on medication.

## 2020-12-09 ENCOUNTER — Encounter: Payer: Self-pay | Admitting: Family Medicine

## 2020-12-10 ENCOUNTER — Telehealth (INDEPENDENT_AMBULATORY_CARE_PROVIDER_SITE_OTHER): Payer: BC Managed Care – PPO | Admitting: Family Medicine

## 2020-12-10 ENCOUNTER — Encounter: Payer: Self-pay | Admitting: Family Medicine

## 2020-12-10 DIAGNOSIS — J069 Acute upper respiratory infection, unspecified: Secondary | ICD-10-CM | POA: Diagnosis not present

## 2020-12-10 NOTE — Progress Notes (Signed)
Symptoms started: 12/05/2020 Took test on 12/05/20: negative 12/08/20: negative  CBG: 93

## 2020-12-10 NOTE — Assessment & Plan Note (Signed)
Home COVID test negative x2 Recommend symptomatic and supportive care with increased fluids and rest.  He may use coricidin or mucinex DM to help with symptoms.  Discussed that if symptoms are not improving or he develops new or worsening symptoms he should contact the [place] for follow up.
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
## 2020-12-17 ENCOUNTER — Encounter: Payer: Self-pay | Admitting: Family Medicine

## 2020-12-17 ENCOUNTER — Other Ambulatory Visit: Payer: Self-pay | Admitting: Family Medicine

## 2020-12-17 MED ORDER — RIVAROXABAN 20 MG PO TABS: 20.0000 mg | ORAL_TABLET | Freq: Every day | ORAL | 1 refills | Status: DC

## 2020-12-31 ENCOUNTER — Other Ambulatory Visit: Payer: Self-pay | Admitting: Family Medicine

## 2020-12-31 MED ORDER — BASAGLAR KWIKPEN 100 UNIT/ML ~~LOC~~ SOPN: 25.0000 [IU] | PEN_INJECTOR | Freq: Every day | SUBCUTANEOUS | 1 refills | Status: DC

## 2021-01-05 LAB — COMPLETE METABOLIC PANEL WITH GFR
AG Ratio: 1.6 (calc) (ref 1.0–2.5)
ALT: 14 U/L (ref 9–46)
AST: 15 U/L (ref 10–35)
Albumin: 4.3 g/dL (ref 3.6–5.1)
Alkaline phosphatase (APISO): 80 U/L (ref 35–144)
BUN: 11 mg/dL (ref 7–25)
CO2: 31 mmol/L (ref 20–32)
Calcium: 9.9 mg/dL (ref 8.6–10.3)
Chloride: 107 mmol/L (ref 98–110)
Creat: 0.85 mg/dL (ref 0.70–1.33)
GFR, Est African American: 115 mL/min/{1.73_m2} (ref >=60)
GFR, Est Non African American: 99 mL/min/{1.73_m2} (ref >=60)
Globulin: 2.7 g/dL (calc) (ref 1.9–3.7)
Glucose, Bld: 134 mg/dL (ref 65–139)
Potassium: 4.3 mmol/L (ref 3.5–5.3)
Sodium: 143 mmol/L (ref 135–146)
Total Bilirubin: 0.4 mg/dL (ref 0.2–1.2)
Total Protein: 7 g/dL (ref 6.1–8.1)

## 2021-01-05 LAB — CBC
HCT: 43.7 % (ref 38.5–50.0)
Hemoglobin: 14.6 g/dL (ref 13.2–17.1)
MCH: 27.7 pg (ref 27.0–33.0)
MCHC: 33.4 g/dL (ref 32.0–36.0)
MCV: 82.9 fL (ref 80.0–100.0)
MPV: 11 fL (ref 7.5–12.5)
Platelets: 276 10*3/uL (ref 140–400)
RBC: 5.27 10*6/uL (ref 4.20–5.80)
RDW: 13.1 % (ref 11.0–15.0)
WBC: 5.7 10*3/uL (ref 3.8–10.8)

## 2021-03-02 ENCOUNTER — Encounter: Payer: Self-pay | Admitting: Family Medicine

## 2021-03-02 ENCOUNTER — Other Ambulatory Visit: Payer: Self-pay

## 2021-03-02 ENCOUNTER — Ambulatory Visit: Payer: BC Managed Care – PPO | Admitting: Family Medicine

## 2021-03-02 VITALS — BP 151/97 | HR 87 | Ht 70.0 in | Wt 214.0 lb

## 2021-03-02 DIAGNOSIS — E1140 Type 2 diabetes mellitus with diabetic neuropathy, unspecified: Secondary | ICD-10-CM

## 2021-03-02 DIAGNOSIS — N529 Male erectile dysfunction, unspecified: Secondary | ICD-10-CM | POA: Diagnosis not present

## 2021-03-02 DIAGNOSIS — I10 Essential (primary) hypertension: Secondary | ICD-10-CM | POA: Diagnosis not present

## 2021-03-02 LAB — POCT GLYCOSYLATED HEMOGLOBIN (HGB A1C): Hemoglobin A1C: 6.7 % — AB (ref 4.0–5.6)

## 2021-03-02 MED ORDER — SILDENAFIL CITRATE 100 MG PO TABS: 50.0000 mg | ORAL_TABLET | Freq: Every day | ORAL | 6 refills | Status: DC | PRN

## 2021-03-02 MED ORDER — FREESTYLE LANCETS MISC: 12 refills | Status: DC

## 2021-03-02 MED ORDER — OZEMPIC (1 MG/DOSE) 2 MG/1.5ML ~~LOC~~ SOPN: 1.0000 mg | PEN_INJECTOR | SUBCUTANEOUS | 1 refills | Status: DC

## 2021-03-02 MED ORDER — FREESTYLE TEST VI STRP: ORAL_STRIP | 12 refills | Status: DC
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
## 2021-03-02 NOTE — Assessment & Plan Note (Signed)
Control of diabetes has improved significantly with addition of Ozempic.  Recommend continuation of current medication.  Continue working on dietary changes to improved glucose levels as well.

## 2021-03-02 NOTE — Assessment & Plan Note (Signed)
Sildenafil renewed

## 2021-03-02 NOTE — Patient Instructions (Signed)
Great to see you today! Keep up the hard work  With weight loss and blood sugar.  See me again in 3 months.

## 2021-03-02 NOTE — Assessment & Plan Note (Signed)
BP elevated today, improved on recheck.  Continue current medications and work on lifestyle change.

## 2021-03-09 ENCOUNTER — Other Ambulatory Visit: Payer: Self-pay

## 2021-03-09 DIAGNOSIS — E119 Type 2 diabetes mellitus without complications: Secondary | ICD-10-CM

## 2021-03-09 MED ORDER — METFORMIN HCL 500 MG PO TABS: 500.0000 mg | ORAL_TABLET | Freq: Two times a day (BID) | ORAL | 2 refills | Status: DC

## 2021-03-09 MED ORDER — EMPAGLIFLOZIN 25 MG PO TABS: 25.0000 mg | ORAL_TABLET | Freq: Every day | ORAL | 2 refills | Status: DC

## 2021-03-11 ENCOUNTER — Encounter: Payer: Self-pay | Admitting: Family Medicine

## 2021-03-13 ENCOUNTER — Encounter: Payer: Self-pay | Admitting: Family Medicine

## 2021-03-13 ENCOUNTER — Ambulatory Visit (INDEPENDENT_AMBULATORY_CARE_PROVIDER_SITE_OTHER): Payer: BC Managed Care – PPO

## 2021-03-13 ENCOUNTER — Other Ambulatory Visit: Payer: Self-pay

## 2021-03-13 ENCOUNTER — Ambulatory Visit: Payer: BC Managed Care – PPO | Admitting: Family Medicine

## 2021-03-13 VITALS — BP 147/85 | HR 87 | Resp 17

## 2021-03-13 DIAGNOSIS — M25551 Pain in right hip: Secondary | ICD-10-CM

## 2021-03-13 MED ORDER — PREDNISONE 50 MG PO TABS: ORAL_TABLET | ORAL | 0 refills | Status: DC

## 2021-03-13 MED ORDER — HYDROCODONE-ACETAMINOPHEN 10-325 MG PO TABS: 1.0000 | ORAL_TABLET | Freq: Three times a day (TID) | ORAL | 0 refills | Status: AC | PRN
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
## 2021-03-13 NOTE — Patient Instructions (Signed)
Hip Bursitis Rehab Ask your health care provider which exercises are safe for you. Do exercises exactly as told by your health care provider and adjust them as directed. It is normal to feel mild stretching, pulling, tightness, or discomfort as you do these exercises. Stop right away if you feel sudden pain or your pain gets worse. Do not begin these exercises until told by your health care provider. Stretching exercise This exercise warms up your muscles and joints and improves the movement and flexibility of your hip. This exercise also helps to relieve pain and stiffness. Iliotibial band stretch An iliotibial band is a strong band of muscle tissue that runs from the outer side of your hip to the outer side of your thigh and knee. 1. Lie on your side with your left / right leg in the top position. 2. Bend your left / right knee and grab your ankle. Stretch out your bottom arm to help you balance. 3. Slowly bring your knee back so your thigh is behind your body. 4. Slowly lower your knee toward the floor until you feel a gentle stretch on the outside of your left / right thigh. If you do not feel a stretch and your knee will not fall farther, place the heel of your other foot on top of your knee and pull your knee down toward the floor with your foot. 5. Hold this position for __________ seconds. 6. Slowly return to the starting position. Repeat __________ times. Complete this exercise __________ times a day.   Strengthening exercises These exercises build strength and endurance in your hip and pelvis. Endurance is the ability to use your muscles for a long time, even after they get tired. Bridge This exercise strengthens the muscles that move your thigh backward (hip extensors). 1. Lie on your back on a firm surface with your knees bent and your feet flat on the floor. 2. Tighten your buttocks muscles and lift your buttocks off the floor until your trunk is level with your thighs. ? Do not arch  your back. ? You should feel the muscles working in your buttocks and the back of your thighs. If you do not feel these muscles, slide your feet 1-2 inches (2.5-5 cm) farther away from your buttocks. ? If this exercise is too easy, try doing it with your arms crossed over your chest. 3. Hold this position for __________ seconds. 4. Slowly lower your hips to the starting position. 5. Let your muscles relax completely after each repetition. Repeat __________ times. Complete this exercise __________ times a day.   Squats This exercise strengthens the muscles in front of your thigh and knee (quadriceps). 1. Stand in front of a table, with your feet and knees pointing straight ahead. You may rest your hands on the table for balance but not for support. 2. Slowly bend your knees and lower your hips like you are going to sit in a chair. ? Keep your weight over your heels, not over your toes. ? Keep your lower legs upright so they are parallel with the table legs. ? Do not let your hips go lower than your knees. ? Do not bend lower than told by your health care provider. ? If your hip pain increases, do not bend as low. 3. Hold the squat position for __________ seconds. 4. Slowly push with your legs to return to standing. Do not use your hands to pull yourself to standing. Repeat __________ times. Complete this exercise __________ times a day.   Hip hike 1. Stand sideways on a bottom step. Stand on your left / right leg with your other foot unsupported next to the step. You can hold on to the railing or wall for balance if needed. 2. Keep your knees straight and your torso square. Then lift your left / right hip up toward the ceiling. 3. Hold this position for __________ seconds. 4. Slowly let your left / right hip lower toward the floor, past the starting position. Your foot should get closer to the floor. Do not lean or bend your knees. Repeat __________ times. Complete this exercise __________ times  a day. Single leg stand 1. Without shoes, stand near a railing or in a doorway. You may hold on to the railing or door frame as needed for balance. 2. Squeeze your left / right buttock muscles, then lift up your other foot. ? Do not let your left / right hip push out to the side. ? It is helpful to stand in front of a mirror for this exercise so you can watch your hip. 3. Hold this position for __________ seconds. Repeat __________ times. Complete this exercise __________ times a day. This information is not intended to replace advice given to you by your health care provider. Make sure you discuss any questions you have with your health care provider. Document Revised: 02/12/2019 Document Reviewed: 02/12/2019 Elsevier Patient Education  2021 [employer]  

## 2021-03-16 NOTE — Progress Notes (Signed)
As we discussed with Dr. Karie Schwalbe, no acute fractures or dislocations on your x-ray. Likely some bursitis. Take the medications we discussed and then follow-up with [place] later this week or next. Hope you are noticing some improvements!

## 2021-04-28 ENCOUNTER — Other Ambulatory Visit: Payer: Self-pay

## 2021-04-28 MED ORDER — PANTOPRAZOLE SODIUM 40 MG PO TBEC: 40.0000 mg | DELAYED_RELEASE_TABLET | Freq: Every day | ORAL | 2 refills | Status: DC

## 2021-05-06 ENCOUNTER — Encounter: Payer: Self-pay | Admitting: Family Medicine

## 2021-06-03 ENCOUNTER — Ambulatory Visit: Payer: BC Managed Care – PPO | Admitting: Family Medicine

## 2021-06-03 ENCOUNTER — Other Ambulatory Visit: Payer: Self-pay

## 2021-06-03 ENCOUNTER — Encounter: Payer: Self-pay | Admitting: Family Medicine

## 2021-06-03 VITALS — BP 160/89 | HR 89 | Ht 70.0 in | Wt 220.0 lb

## 2021-06-03 DIAGNOSIS — E1169 Type 2 diabetes mellitus with other specified complication: Secondary | ICD-10-CM

## 2021-06-03 DIAGNOSIS — I10 Essential (primary) hypertension: Secondary | ICD-10-CM

## 2021-06-03 DIAGNOSIS — Z1211 Encounter for screening for malignant neoplasm of colon: Secondary | ICD-10-CM | POA: Diagnosis not present

## 2021-06-03 DIAGNOSIS — E785 Hyperlipidemia, unspecified: Secondary | ICD-10-CM

## 2021-06-03 DIAGNOSIS — Z23 Encounter for immunization: Secondary | ICD-10-CM

## 2021-06-03 DIAGNOSIS — E119 Type 2 diabetes mellitus without complications: Secondary | ICD-10-CM

## 2021-06-03 DIAGNOSIS — I480 Paroxysmal atrial fibrillation: Secondary | ICD-10-CM

## 2021-06-03 DIAGNOSIS — E1140 Type 2 diabetes mellitus with diabetic neuropathy, unspecified: Secondary | ICD-10-CM

## 2021-06-03 LAB — POCT GLYCOSYLATED HEMOGLOBIN (HGB A1C): HbA1c, POC (controlled diabetic range): 7.7 % — AB (ref 0.0–7.0)

## 2021-06-03 MED ORDER — OZEMPIC (0.25 OR 0.5 MG/DOSE) 2 MG/1.5ML ~~LOC~~ SOPN: PEN_INJECTOR | SUBCUTANEOUS | 0 refills | Status: DC

## 2021-06-03 MED ORDER — ATORVASTATIN CALCIUM 80 MG PO TABS: 80.0000 mg | ORAL_TABLET | Freq: Every day | ORAL | 2 refills | Status: DC

## 2021-06-03 MED ORDER — LISINOPRIL 20 MG PO TABS: 20.0000 mg | ORAL_TABLET | Freq: Every day | ORAL | 2 refills | Status: DC

## 2021-06-03 MED ORDER — RIVAROXABAN 20 MG PO TABS: 20.0000 mg | ORAL_TABLET | Freq: Every day | ORAL | 1 refills | Status: DC

## 2021-06-03 MED ORDER — METOPROLOL SUCCINATE ER 25 MG PO TB24: 25.0000 mg | ORAL_TABLET | Freq: Every day | ORAL | 2 refills | Status: DC

## 2021-06-03 NOTE — Assessment & Plan Note (Signed)
Atorvastatin renewed.  Update lipid panel.

## 2021-06-03 NOTE — Assessment & Plan Note (Signed)
His rate remains controlled with metoprolol.  Denies symptoms at this time.  Recommend continuation of Xarelto for anticoagulation.

## 2021-06-03 NOTE — Assessment & Plan Note (Addendum)
Blood pressure remains elevated today.  Increase lisinopril to 20 mg daily.  Continue metoprolol at current strength.  Low-sodium diet stressed, given handout on DASH diet.

## 2021-06-03 NOTE — Assessment & Plan Note (Signed)
Worsening control diabetes since last visit.  Restart Ozempic at 0.25 mg x 1 month then increase to 0.5 mg weekly.  Continue metformin, Jardiance and Basaglar.  Recommend low carbohydrate diet.

## 2021-06-03 NOTE — Patient Instructions (Addendum)
Great to see you! Restart ozempic Increase lisinopril to 20mg  daily Watch salt intake.  Refer to DASH diet below. Stay active See me again in 3 months.    .pdf">  DASH Eating Plan DASH stands for Dietary Approaches to Stop Hypertension. The DASH eating plan is a healthy eating plan that has been shown to: Reduce high blood pressure (hypertension). Reduce your risk for type 2 diabetes, heart disease, and stroke. Help with weight loss. What are tips for following this plan? Reading food labels Check food labels for the amount of salt (sodium) per serving. Choose foods with less than 5 percent of the Daily Value of sodium. Generally, foods with less than 300 milligrams (mg) of sodium per serving fit into this eating plan. To find whole grains, look for the word "whole" as the first word in the ingredient list. Shopping Buy products labeled as "low-sodium" or "no salt added." Buy fresh foods. Avoid canned foods and pre-made or frozen meals. Cooking Avoid adding salt when cooking. Use salt-free seasonings or herbs instead of table salt or sea salt. Check with your health care provider or pharmacist before using salt substitutes. Do not fry foods. Cook foods using healthy methods such as baking, boiling, grilling, roasting, and broiling instead. Cook with heart-healthy oils, such as olive, canola, avocado, soybean, or sunflower oil. Meal planning  Eat a balanced diet that includes: 4 or more servings of fruits and 4 or more servings of vegetables each day. Try to fill one-half of your plate with fruits and vegetables. 6-8 servings of whole grains each day. Less than 6 oz (170 g) of lean meat, poultry, or fish each day. A 3-oz (85-g) serving of meat is about the same size as a deck of cards. One egg equals 1 oz (28 g). 2-3 servings of low-fat dairy each day. One serving is 1 cup (237 mL). 1 serving of nuts, seeds, or beans 5 times  each week. 2-3 servings of heart-healthy fats. Healthy fats called omega-3 fatty acids are found in foods such as walnuts, flaxseeds, fortified milks, and eggs. These fats are also found in cold-water fish, such as sardines, salmon, and mackerel. Limit how much you eat of: Canned or prepackaged foods. Food that is high in trans fat, such as some fried foods. Food that is high in saturated fat, such as fatty meat. Desserts and other sweets, sugary drinks, and other foods with added sugar. Full-fat dairy products. Do not salt foods before eating. Do not eat more than 4 egg yolks a week. Try to eat at least 2 vegetarian meals a week. Eat more home-cooked food and less restaurant, buffet, and fast food.  Lifestyle When eating at a restaurant, ask that your food be prepared with less salt or no salt, if possible. If you drink alcohol: Limit how much you use to: 0-1 drink a day for women who are not pregnant. 0-2 drinks a day for men. Be aware of how much alcohol is in your drink. In the [place] one drink equals one 12 oz bottle of beer (355 mL), one 5 oz glass of wine (148 mL), or one 1 oz glass of hard liquor (44 mL). General information Avoid eating more than 2,300 mg of salt a day. If you have hypertension, you may need to reduce your sodium intake to 1,500 mg a day. Work with your health care provider to maintain a healthy body weight or to lose weight. Ask what an ideal weight is for you. Get at least  30 minutes of exercise that causes your heart to beat faster (aerobic exercise) most days of the week. Activities may include walking, swimming, or biking. Work with your health care provider or dietitian to adjust your eating plan to your individual calorie needs. What foods should I eat? Fruits All fresh, dried, or frozen fruit. Canned fruit in natural juice (without addedsugar). Vegetables Fresh or frozen vegetables (raw, steamed, roasted, or grilled). Low-sodium or reduced-sodium tomato  and vegetable juice. Low-sodium or reduced-sodium tomatosauce and tomato paste. Low-sodium or reduced-sodium canned vegetables. Grains Whole-grain or whole-wheat bread. Whole-grain or whole-wheat pasta. Brown rice. Orpah Cobb. Bulgur. Whole-grain and low-sodium cereals. Pita bread.Low-fat, low-sodium crackers. Whole-wheat flour tortillas. Meats and other proteins Skinless chicken or [place]. Ground chicken or [place]. Pork with fat trimmed off. Fish and seafood. Egg whites. Dried beans, peas, or lentils. Unsalted nuts, nut butters, and seeds. Unsalted canned beans. Lean cuts of beef with fat trimmed off. Low-sodium, lean precooked or cured meat, such as sausages or meatloaves. Dairy Low-fat (1%) or fat-free (skim) milk. Reduced-fat, low-fat, or fat-free cheeses. Nonfat, low-sodium ricotta or cottage cheese. Low-fat or nonfatyogurt. Low-fat, low-sodium cheese. Fats and oils Soft margarine without trans fats. Vegetable oil. Reduced-fat, low-fat, or light mayonnaise and salad dressings (reduced-sodium). Canola, safflower, olive, avocado, soybean, andsunflower oils. Avocado. Seasonings and condiments Herbs. Spices. Seasoning mixes without salt. Other foods Unsalted popcorn and pretzels. Fat-free sweets. The items listed above may not be a complete list of foods and beverages you can eat. Contact a dietitian for more information. What foods should I avoid? Fruits Canned fruit in a light or heavy syrup. Fried fruit. Fruit in cream or buttersauce. Vegetables Creamed or fried vegetables. Vegetables in a cheese sauce. Regular canned vegetables (not low-sodium or reduced-sodium). Regular canned tomato sauce and paste (not low-sodium or reduced-sodium). Regular tomato and vegetable juice(not low-sodium or reduced-sodium). Rosita Fire. Olives. Grains Baked goods made with fat, such as croissants, muffins, or some breads. Drypasta or rice meal packs. Meats and other proteins Fatty cuts of meat. Ribs. Fried  meat. Tomasa Blase. Bologna, salami, and other precooked or cured meats, such as sausages or meat loaves. Fat from the back of a pig (fatback). Bratwurst. Salted nuts and seeds. Canned beans with added salt. Canned orsmoked fish. Whole eggs or egg yolks. Chicken or [place] with skin. Dairy Whole or 2% milk, cream, and half-and-half. Whole or full-fat cream cheese. Whole-fat or sweetened yogurt. Full-fat cheese. Nondairy creamers. Whippedtoppings. Processed cheese and cheese spreads. Fats and oils Butter. Stick margarine. Lard. Shortening. Ghee. Bacon fat. Tropical oils, suchas coconut, palm kernel, or palm oil. Seasonings and condiments Onion salt, garlic salt, seasoned salt, table salt, and sea salt. Worcestershire sauce. Tartar sauce. Barbecue sauce. Teriyaki sauce. Soy sauce, including reduced-sodium. Steak sauce. Canned and packaged gravies. Fish sauce. Oyster sauce. Cocktail sauce. Store-bought horseradish. Ketchup. Mustard. Meat flavorings and tenderizers. Bouillon cubes. Hot sauces. Pre-made or packaged marinades. Pre-made or packaged taco seasonings. Relishes. Regular saladdressings. Other foods Salted popcorn and pretzels. The items listed above may not be a complete list of foods and beverages you should avoid. Contact a dietitian for more information. Where to find more information National Heart, Lung, and Blood Institute: PopSteam.is American Heart Association: www.heart.org Academy of Nutrition and Dietetics: www.eatright.org National Kidney Foundation: www.kidney.org Summary The DASH eating plan is a healthy eating plan that has been shown to reduce high blood pressure (hypertension). It may also reduce your risk for type 2 diabetes, heart disease, and stroke. When on the DASH eating  plan, aim to eat more fresh fruits and vegetables, whole grains, lean proteins, low-fat dairy, and heart-healthy fats. With the DASH eating plan, you should limit salt (sodium) intake to 2,300 mg a day. If  you have hypertension, you may need to reduce your sodium intake to 1,500 mg a day. Work with your health care provider or dietitian to adjust your eating plan to your individual calorie needs. This information is not intended to replace advice given to you by your health care provider. Make sure you discuss any questions you have with your healthcare provider. Document Revised: 09/21/2019 Document Reviewed: 09/21/2019 Elsevier Patient Education  2022 [employer].
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
## 2021-06-04 ENCOUNTER — Encounter: Payer: Self-pay | Admitting: Family Medicine

## 2021-06-04 LAB — LIPID PANEL W/REFLEX DIRECT LDL
Cholesterol: 237 mg/dL — ABNORMAL HIGH (ref <200)
HDL: 51 mg/dL (ref >=40)
LDL Cholesterol (Calc): 167 mg/dL (calc) — ABNORMAL HIGH
Non-HDL Cholesterol (Calc): 186 mg/dL (calc) — ABNORMAL HIGH (ref <130)
Total CHOL/HDL Ratio: 4.6 (calc) (ref <5.0)
Triglycerides: 83 mg/dL (ref <150)

## 2021-09-03 ENCOUNTER — Ambulatory Visit: Payer: BC Managed Care – PPO | Admitting: Family Medicine

## 2021-09-04 ENCOUNTER — Ambulatory Visit: Payer: BC Managed Care – PPO | Admitting: Family Medicine

## 2021-09-23 ENCOUNTER — Ambulatory Visit: Payer: BC Managed Care – PPO | Admitting: Family Medicine

## 2021-09-23 ENCOUNTER — Other Ambulatory Visit: Payer: Self-pay

## 2021-09-23 ENCOUNTER — Other Ambulatory Visit: Payer: Self-pay | Admitting: Family Medicine

## 2021-09-23 ENCOUNTER — Encounter: Payer: Self-pay | Admitting: Family Medicine

## 2021-09-23 VITALS — BP 164/95 | HR 82 | Wt 225.0 lb

## 2021-09-23 DIAGNOSIS — Z23 Encounter for immunization: Secondary | ICD-10-CM

## 2021-09-23 DIAGNOSIS — I10 Essential (primary) hypertension: Secondary | ICD-10-CM

## 2021-09-23 DIAGNOSIS — E119 Type 2 diabetes mellitus without complications: Secondary | ICD-10-CM | POA: Diagnosis not present

## 2021-09-23 DIAGNOSIS — E1169 Type 2 diabetes mellitus with other specified complication: Secondary | ICD-10-CM

## 2021-09-23 DIAGNOSIS — I480 Paroxysmal atrial fibrillation: Secondary | ICD-10-CM

## 2021-09-23 DIAGNOSIS — E1140 Type 2 diabetes mellitus with diabetic neuropathy, unspecified: Secondary | ICD-10-CM

## 2021-09-23 DIAGNOSIS — E785 Hyperlipidemia, unspecified: Secondary | ICD-10-CM

## 2021-09-23 DIAGNOSIS — N529 Male erectile dysfunction, unspecified: Secondary | ICD-10-CM

## 2021-09-23 LAB — POCT GLYCOSYLATED HEMOGLOBIN (HGB A1C): HbA1c, POC (controlled diabetic range): 8.1 % — AB (ref 0.0–7.0)

## 2021-09-23 MED ORDER — ATORVASTATIN CALCIUM 80 MG PO TABS: 80.0000 mg | ORAL_TABLET | Freq: Every day | ORAL | 2 refills | Status: DC

## 2021-09-23 MED ORDER — BASAGLAR KWIKPEN 100 UNIT/ML ~~LOC~~ SOPN: 25.0000 [IU] | PEN_INJECTOR | Freq: Every day | SUBCUTANEOUS | 1 refills | Status: DC

## 2021-09-23 MED ORDER — LISINOPRIL 20 MG PO TABS: 20.0000 mg | ORAL_TABLET | Freq: Every day | ORAL | 2 refills | Status: DC

## 2021-09-23 MED ORDER — METFORMIN HCL 500 MG PO TABS: 500.0000 mg | ORAL_TABLET | Freq: Two times a day (BID) | ORAL | 2 refills | Status: DC

## 2021-09-23 MED ORDER — PANTOPRAZOLE SODIUM 40 MG PO TBEC: 40.0000 mg | DELAYED_RELEASE_TABLET | Freq: Every day | ORAL | 2 refills | Status: DC

## 2021-09-23 MED ORDER — SILDENAFIL CITRATE 100 MG PO TABS: 50.0000 mg | ORAL_TABLET | Freq: Every day | ORAL | 6 refills | Status: DC | PRN

## 2021-09-23 MED ORDER — EMPAGLIFLOZIN 25 MG PO TABS: 25.0000 mg | ORAL_TABLET | Freq: Every day | ORAL | 2 refills | Status: DC

## 2021-09-23 MED ORDER — CARVEDILOL 12.5 MG PO TABS: 12.5000 mg | ORAL_TABLET | Freq: Two times a day (BID) | ORAL | 1 refills | Status: DC

## 2021-09-23 MED ORDER — RIVAROXABAN 20 MG PO TABS: 20.0000 mg | ORAL_TABLET | Freq: Every day | ORAL | 1 refills | Status: DC

## 2021-09-23 MED ORDER — OZEMPIC (0.25 OR 0.5 MG/DOSE) 2 MG/1.5ML ~~LOC~~ SOPN: PEN_INJECTOR | SUBCUTANEOUS | 1 refills | Status: DC

## 2021-09-23 NOTE — Assessment & Plan Note (Signed)
Continue on beta-blocker for rate control.  Continue with Xarelto for anticoagulation.

## 2021-09-23 NOTE — Assessment & Plan Note (Signed)
Lab Results  Component Value Date   HGBA1C 8.1 (A) 09/23/2021   Diabetes is not well controlled.  Continue Basaglar current strength in addition to Jardiance and metformin.  I recommend that he add Ozempic back on.  Work on dietary change.

## 2021-09-23 NOTE — Patient Instructions (Signed)
Start carvedilol to replace metoprolol Restart Ozempic.  See me again in 3 months.

## 2021-09-23 NOTE — Assessment & Plan Note (Signed)
Blood pressures not well controlled.  He will continue lisinopril at current strength.  Switching metoprolol to carvedilol for better blood pressure control.  Low-sodium diet and weight loss encouraged.

## 2021-09-23 NOTE — Assessment & Plan Note (Addendum)
He is doing well with atorvastatin for management of hyperlipidemia.  Continue current strength. 
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
## 2021-09-23 NOTE — Assessment & Plan Note (Signed)
Sildenafil renewed

## 2021-10-05 ENCOUNTER — Encounter: Payer: Self-pay | Admitting: Family Medicine

## 2021-12-02 ENCOUNTER — Other Ambulatory Visit: Payer: Self-pay

## 2021-12-02 MED ORDER — FREESTYLE TEST VI STRP: ORAL_STRIP | 12 refills | Status: DC

## 2021-12-17 ENCOUNTER — Encounter: Payer: Self-pay | Admitting: Family Medicine

## 2021-12-17 ENCOUNTER — Other Ambulatory Visit: Payer: Self-pay

## 2021-12-17 ENCOUNTER — Ambulatory Visit: Payer: BC Managed Care – PPO | Admitting: Family Medicine

## 2021-12-17 DIAGNOSIS — J209 Acute bronchitis, unspecified: Secondary | ICD-10-CM | POA: Diagnosis not present

## 2021-12-17 DIAGNOSIS — E119 Type 2 diabetes mellitus without complications: Secondary | ICD-10-CM | POA: Diagnosis not present

## 2021-12-17 DIAGNOSIS — I10 Essential (primary) hypertension: Secondary | ICD-10-CM

## 2021-12-17 MED ORDER — BASAGLAR KWIKPEN 100 UNIT/ML ~~LOC~~ SOPN: 25.0000 [IU] | PEN_INJECTOR | Freq: Every day | SUBCUTANEOUS | 1 refills | Status: DC

## 2021-12-17 MED ORDER — OZEMPIC (0.25 OR 0.5 MG/DOSE) 2 MG/1.5ML ~~LOC~~ SOPN: PEN_INJECTOR | SUBCUTANEOUS | 1 refills | Status: DC

## 2021-12-17 MED ORDER — ALBUTEROL SULFATE HFA 108 (90 BASE) MCG/ACT IN AERS: 2.0000 | INHALATION_SPRAY | Freq: Four times a day (QID) | RESPIRATORY_TRACT | 0 refills | Status: DC | PRN

## 2021-12-17 MED ORDER — RIVAROXABAN 20 MG PO TABS: 20.0000 mg | ORAL_TABLET | Freq: Every day | ORAL | 1 refills | Status: DC

## 2021-12-17 MED ORDER — LISINOPRIL 20 MG PO TABS: 20.0000 mg | ORAL_TABLET | Freq: Every day | ORAL | 2 refills | Status: DC

## 2021-12-17 MED ORDER — EMPAGLIFLOZIN 25 MG PO TABS: 25.0000 mg | ORAL_TABLET | Freq: Every day | ORAL | 2 refills | Status: DC

## 2021-12-17 MED ORDER — HYDROCODONE BIT-HOMATROP MBR 5-1.5 MG/5ML PO SOLN: 5.0000 mL | Freq: Three times a day (TID) | ORAL | 0 refills | Status: DC | PRN

## 2021-12-17 MED ORDER — PANTOPRAZOLE SODIUM 40 MG PO TBEC: 40.0000 mg | DELAYED_RELEASE_TABLET | Freq: Every day | ORAL | 2 refills | Status: DC

## 2021-12-17 MED ORDER — METFORMIN HCL 500 MG PO TABS: 500.0000 mg | ORAL_TABLET | Freq: Two times a day (BID) | ORAL | 2 refills | Status: DC

## 2021-12-17 MED ORDER — PREDNISONE 20 MG PO TABS: 20.0000 mg | ORAL_TABLET | Freq: Two times a day (BID) | ORAL | 0 refills | Status: AC

## 2021-12-17 NOTE — Patient Instructions (Signed)
Acute Bronchitis, Adult ?Acute bronchitis is when air tubes in the lungs (bronchi) suddenly get swollen. The condition can make it hard for you to breathe. In adults, acute bronchitis usually goes away within 2 weeks. A cough caused by bronchitis may last up to 3 weeks. Smoking, allergies, and asthma can make the condition worse. ?What are the causes? ?Germs that cause cold and flu (viruses). The most common cause of this condition is the virus that causes the common cold. ?Bacteria. ?Substances that bother (irritate) the lungs, including: ?Smoke from cigarettes and other types of tobacco. ?Dust and pollen. ?Fumes from chemicals, gases, or burned fuel. ?Indoor or outdoor air pollution. ?What increases the risk? ?A weak body's defense system. This is also called the immune system. ?Any condition that affects your lungs and breathing, such as asthma. ?What are the signs or symptoms? ?A cough. ?Coughing up clear, yellow, or green mucus. ?Making high-pitched whistling sounds when you breathe, most often when you breathe out (wheezing). ?Runny or stuffy nose. ?Having too much mucus in your lungs (chest congestion). ?Shortness of breath. ?Body aches. ?A sore throat. ?How is this treated? ?Acute bronchitis may go away over time without treatment. Your doctor may tell you to: ?Drink more fluids. This will help thin your mucus so it is easier to cough up. ?Use a device that gets medicine into your lungs (inhaler). ?Use a vaporizer or a humidifier. These are machines that add water to the air. This helps with coughing and poor breathing. ?Take a medicine that thins mucus and helps clear it from your lungs. ?Take a medicine that prevents or stops coughing. ?It is not common to take an antibiotic medicine for this condition. ?Follow these instructions at home: ? ?Take over-the-counter and prescription medicines only as told by your doctor. ?Use an inhaler, vaporizer, or humidifier as told by your doctor. ?Take two teaspoons (10  mL) of honey at bedtime. This helps lessen your coughing at night. ?Drink enough fluid to keep your pee (urine) pale yellow. ?Do not smoke or use any products that contain nicotine or tobacco. If you need help quitting, ask your doctor. ?Get a lot of rest. ?Return to your normal activities when your doctor says that it is safe. ?Keep all follow-up visits. ?How is this prevented? ? ?Wash your hands often with soap and water for at least 20 seconds. If you cannot use soap and water, use hand sanitizer. ?Avoid contact with people who have cold symptoms. ?Try not to touch your mouth, nose, or eyes with your hands. ?Avoid breathing in smoke or chemical fumes. ?Make sure to get the flu shot every year. ?Contact a doctor if: ?Your symptoms do not get better in 2 weeks. ?You have trouble coughing up the mucus. ?Your cough keeps you awake at night. ?You have a fever. ?Get help right away if: ?You cough up blood. ?You have chest pain. ?You have very bad shortness of breath. ?You faint or keep feeling like you are going to faint. ?You have a very bad headache. ?Your fever or chills get worse. ?These symptoms may be an emergency. Get help right away. Call your local emergency services (911 in the [place] ?Do not wait to see if the symptoms will go away. ?Do not drive yourself to the hospital. ?Summary ?Acute bronchitis is when air tubes in the lungs (bronchi) suddenly get swollen. In adults, acute bronchitis usually goes away within 2 weeks. ?Drink more fluids. This will help thin your mucus so it is easier to   cough up. ?Take over-the-counter and prescription medicines only as told by your doctor. ?Contact a doctor if your symptoms do not improve after 2 weeks of treatment. ?This information is not intended to replace advice given to you by your health care provider. Make sure you discuss any questions you have with your health care provider. ?Document Revised: 02/18/2021 Document Reviewed: 02/18/2021 ?Elsevier Patient Education  ? 2022 [employer] ? ?
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
## 2021-12-17 NOTE — Assessment & Plan Note (Signed)
Continue supportive care at home with increased fluids and rest.  Adding course of prednisone and albuterol as needed.  Hycodan cough syrup sent in as well.  Instructed to follow-up if symptoms fail to improve with prescribed treatment or if having worsening symptoms.

## 2021-12-25 ENCOUNTER — Ambulatory Visit: Payer: BC Managed Care – PPO | Admitting: Family Medicine

## 2022-01-21 ENCOUNTER — Encounter: Payer: Self-pay | Admitting: Family Medicine

## 2022-01-21 ENCOUNTER — Ambulatory Visit (INDEPENDENT_AMBULATORY_CARE_PROVIDER_SITE_OTHER): Payer: BC Managed Care – PPO | Admitting: Family Medicine

## 2022-01-21 ENCOUNTER — Other Ambulatory Visit: Payer: Self-pay

## 2022-01-21 VITALS — BP 168/92 | HR 79 | Ht 70.0 in | Wt 223.0 lb

## 2022-01-21 DIAGNOSIS — E1169 Type 2 diabetes mellitus with other specified complication: Secondary | ICD-10-CM

## 2022-01-21 DIAGNOSIS — I480 Paroxysmal atrial fibrillation: Secondary | ICD-10-CM | POA: Diagnosis not present

## 2022-01-21 DIAGNOSIS — E1140 Type 2 diabetes mellitus with diabetic neuropathy, unspecified: Secondary | ICD-10-CM | POA: Diagnosis not present

## 2022-01-21 DIAGNOSIS — E785 Hyperlipidemia, unspecified: Secondary | ICD-10-CM

## 2022-01-21 DIAGNOSIS — K047 Periapical abscess without sinus: Secondary | ICD-10-CM

## 2022-01-21 DIAGNOSIS — I10 Essential (primary) hypertension: Secondary | ICD-10-CM

## 2022-01-21 LAB — POCT GLYCOSYLATED HEMOGLOBIN (HGB A1C): HbA1c, POC (controlled diabetic range): 9.2 % — AB (ref 0.0–7.0)

## 2022-01-21 MED ORDER — LISINOPRIL 40 MG PO TABS: 40.0000 mg | ORAL_TABLET | Freq: Every day | ORAL | 2 refills | Status: DC

## 2022-01-21 MED ORDER — TRAMADOL HCL 50 MG PO TABS: 50.0000 mg | ORAL_TABLET | Freq: Three times a day (TID) | ORAL | 0 refills | Status: AC | PRN

## 2022-01-21 NOTE — Assessment & Plan Note (Signed)
Diabetes remains poorly controlled.  Given coupon today for Ozempic to reduce his co-pay on this.  He will let me know if this still remains too expensive.  Encouraged dietary change. ?

## 2022-01-21 NOTE — Assessment & Plan Note (Signed)
History of recurrent dental abscess.  I did provide him with a few tramadol to use as needed.  Recommend that he have extraction of this ASAP. ?

## 2022-01-21 NOTE — Patient Instructions (Addendum)
Increase lisinopril to 40mg  daily for blood pressure.  Keep sodium intake below 2000mg /day.  ? ?Try coupon for Ozempic.  ? ?Have labs completed when fasting.  ? ?See me again in 3 months. ?
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
## 2022-01-21 NOTE — Assessment & Plan Note (Signed)
Blood pressure elevated.  Increasing lisinopril to 40 mg daily.  Nurse visit in 2 to 3 weeks for blood pressure recheck.  Recommend checking blood pressure at home as well.  Low-sodium diet encouraged. ?

## 2022-01-21 NOTE — Assessment & Plan Note (Signed)
Rate control with carvedilol.  He is anticoagulated with Eliquis.  Doing well at this time. ?

## 2022-02-03 ENCOUNTER — Other Ambulatory Visit: Payer: Self-pay

## 2022-02-03 DIAGNOSIS — E1140 Type 2 diabetes mellitus with diabetic neuropathy, unspecified: Secondary | ICD-10-CM

## 2022-02-03 MED ORDER — BD PEN NEEDLE MICRO U/F 32G X 6 MM MISC: 3 refills | Status: DC

## 2022-02-22 ENCOUNTER — Other Ambulatory Visit: Payer: Self-pay

## 2022-02-22 ENCOUNTER — Emergency Department (HOSPITAL_COMMUNITY)
Admission: EM | Admit: 2022-02-22 | Discharge: 2022-02-22 | Disposition: A | Discharge status: Home / Self Care | Payer: BC Managed Care – PPO | Attending: Emergency Medicine | Admitting: Emergency Medicine

## 2022-02-22 ENCOUNTER — Emergency Department (HOSPITAL_COMMUNITY): Payer: BC Managed Care – PPO

## 2022-02-22 ENCOUNTER — Encounter (HOSPITAL_COMMUNITY): Payer: Self-pay | Admitting: Emergency Medicine

## 2022-02-22 DIAGNOSIS — Z79899 Other long term (current) drug therapy: Secondary | ICD-10-CM | POA: Diagnosis not present

## 2022-02-22 DIAGNOSIS — K0889 Other specified disorders of teeth and supporting structures: Secondary | ICD-10-CM | POA: Insufficient documentation

## 2022-02-22 DIAGNOSIS — I10 Essential (primary) hypertension: Secondary | ICD-10-CM | POA: Insufficient documentation

## 2022-02-22 DIAGNOSIS — Z7984 Long term (current) use of oral hypoglycemic drugs: Secondary | ICD-10-CM | POA: Insufficient documentation

## 2022-02-22 DIAGNOSIS — E119 Type 2 diabetes mellitus without complications: Secondary | ICD-10-CM | POA: Diagnosis not present

## 2022-02-22 DIAGNOSIS — Z794 Long term (current) use of insulin: Secondary | ICD-10-CM | POA: Insufficient documentation

## 2022-02-22 LAB — COMPREHENSIVE METABOLIC PANEL
ALT: 19 U/L (ref 0–44)
AST: 19 U/L (ref 15–41)
Albumin: 4.4 g/dL (ref 3.5–5.0)
Alkaline Phosphatase: 84 U/L (ref 38–126)
Anion gap: 7 (ref 5–15)
BUN: 8 mg/dL (ref 6–20)
CO2: 26 mmol/L (ref 22–32)
Calcium: 9.3 mg/dL (ref 8.9–10.3)
Chloride: 106 mmol/L (ref 98–111)
Creatinine, Ser: 0.68 mg/dL (ref 0.61–1.24)
GFR, Estimated: >60 mL/min (ref >60)
Glucose, Bld: 115 mg/dL — ABNORMAL HIGH (ref 70–99)
Potassium: 3.3 mmol/L — ABNORMAL LOW (ref 3.5–5.1)
Sodium: 139 mmol/L (ref 135–145)
Total Bilirubin: 0.7 mg/dL (ref 0.3–1.2)
Total Protein: 7.7 g/dL (ref 6.5–8.1)

## 2022-02-22 LAB — CBC WITH DIFFERENTIAL/PLATELET
Abs Immature Granulocytes: 0.02 10*3/uL (ref 0.00–0.07)
Basophils Absolute: 0 10*3/uL (ref 0.0–0.1)
Basophils Relative: 0 %
Eosinophils Absolute: 0.1 10*3/uL (ref 0.0–0.5)
Eosinophils Relative: 1 %
HCT: 44.7 % (ref 39.0–52.0)
Hemoglobin: 15.3 g/dL (ref 13.0–17.0)
Immature Granulocytes: 0 %
Lymphocytes Relative: 33 %
Lymphs Abs: 2.7 10*3/uL (ref 0.7–4.0)
MCH: 29 pg (ref 26.0–34.0)
MCHC: 34.2 g/dL (ref 30.0–36.0)
MCV: 84.7 fL (ref 80.0–100.0)
Monocytes Absolute: 0.5 10*3/uL (ref 0.1–1.0)
Monocytes Relative: 6 %
Neutro Abs: 4.9 10*3/uL (ref 1.7–7.7)
Neutrophils Relative %: 60 %
Platelets: 268 10*3/uL (ref 150–400)
RBC: 5.28 MIL/uL (ref 4.22–5.81)
RDW: 12.8 % (ref 11.5–15.5)
WBC: 8.2 10*3/uL (ref 4.0–10.5)
nRBC: 0 % (ref 0.0–0.2)

## 2022-02-22 LAB — CBG MONITORING, ED: Glucose-Capillary: 117 mg/dL — ABNORMAL HIGH (ref 70–99)

## 2022-02-22 LAB — URINALYSIS, ROUTINE W REFLEX MICROSCOPIC
Bacteria, UA: NONE SEEN
Bilirubin Urine: NEGATIVE
Glucose, UA: >=500 mg/dL — AB
Hgb urine dipstick: NEGATIVE
Ketones, ur: 20 mg/dL — AB
Leukocytes,Ua: NEGATIVE
Nitrite: NEGATIVE
Protein, ur: NEGATIVE mg/dL
Specific Gravity, Urine: 1.032 — ABNORMAL HIGH (ref 1.005–1.030)
pH: 5 (ref 5.0–8.0)

## 2022-02-22 LAB — TROPONIN I (HIGH SENSITIVITY)
Troponin I (High Sensitivity): 4 ng/L (ref <18)
Troponin I (High Sensitivity): 4 ng/L (ref <18)

## 2022-02-22 MED ORDER — AMOXICILLIN-POT CLAVULANATE 875-125 MG PO TABS
1.0000 | ORAL_TABLET | Freq: Once | ORAL | Status: AC
  Administered 2022-02-22: 1 via ORAL
  Filled 2022-02-22: qty 1

## 2022-02-22 MED ORDER — AMOXICILLIN-POT CLAVULANATE 875-125 MG PO TABS: 1.0000 | ORAL_TABLET | Freq: Two times a day (BID) | ORAL | 0 refills | Status: DC

## 2022-02-22 NOTE — ED Triage Notes (Signed)
Patient coming from home, complaint of dizziness. Pt with hx of HTN, noncompliant with medications. BP in 200s with EMS. ?

## 2022-02-22 NOTE — ED Provider Triage Note (Signed)
Emergency Medicine Provider Triage Evaluation Note ? ?Roger Fisher , a 56 y.o. male  was evaluated in triage.  Pt complains of dizziness.  Patient reports the dizziness has been present since waking up approximately 5:00 this morning.  Patient states that dizziness has been constant since then.  Intensity of dizziness has waxed and waned.  Patient states that earlier this morning while getting out of a car his dizziness became much worse.  Patient states that his legs felt like "rubber," and he thought he was going to pass out.  Patient did have some episodes of shortness of breath as well.  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 ? ?Per EMS patient is noncompliant with his hypertension medications. ? ?Review of Systems  ?Positive: Headache, fatigue, dizziness, lightheadedness, shortness of breath ?Negative: Fever, chills, numbness, weakness, facial asymmetry, dysarthria, visual disturbance, chest pain, palpitations, syncope ? ?Physical Exam  ?BP (!) 183/95 (BP Location: Right Arm)   Pulse 94   Temp 98.3 ?F (36.8 ?C) (Oral)   Resp 17   SpO2 92%  ?Gen:   Awake, no distress   ?Resp:  Normal effort, clear to auscultation bilaterally ?MSK:   Moves extremities without difficulty  ?Other:  +2 radial pulse bilaterally.  + Strength to bilateral upper and lower extremities.  No dysarthria or facial asymmetry ? ?Medical Decision Making  ?Medically screening exam initiated at 12:45 PM.  Appropriate orders placed.  Roger Fisher was informed that the remainder of the evaluation will be completed by another provider, this initial triage assessment does not replace that evaluation, and the importance of remaining in the ED until their evaluation is complete. ? ? ?  ?Haskel Schroeder, PA-C ?02/22/22 1247 ? ?

## 2022-02-22 NOTE — Discharge Instructions (Addendum)
Return for any problem.  ? ?Take Augmentin as prescribed for dental pain. ?

## 2022-02-22 NOTE — ED Provider Notes (Signed)
?[facility] ?Provider Note ? ? ?CSN: [id number] ?Arrival date & time: 02/22/22  1225 ? ?  ? ?History ? ?Chief Complaint  ?Patient presents with  ? Dizziness  ? Hypertension  ? ? ?Roger Fisher is a 56 y.o. male. ? ?56 year old male with prior medical history as detailed below presents for evaluation.  Patient reports that earlier this morning when he was dropping his wife off at a dental appointment he felt dizzy and lightheaded.  Symptoms lasted approximately 30 minutes. ? ?Patient now feels fine.  He is denying any symptoms.  He has been waiting 8+ hours for evaluation. ? ?Patient reports that he did not take his blood pressure medicines yesterday evening or this morning.  Patient reports that after his episode of lightheadedness he had his blood pressure checked and it was elevated. ? ?Patient also reports that he skipped breakfast this morning.  He reports dental pain to the right lower jaw.  Because of this pain he did not feel like eating breakfast. ? ?Patient denies associated chest pain or shortness of breath.  Patient denies focal neurologic weakness.  Patient denies actual syncope. ? ?The history is provided by the patient and medical records.  ?Dizziness ?Quality:  Lightheadedness ?Severity:  Mild ?Onset quality:  Sudden ?Duration:  1 hour ?Timing:  Rare ?Progression:  Resolved ?Chronicity:  New ?Hypertension ? ? ?  ? ?Home Medications ?Prior to Admission medications   ?Medication Sig Start Date End Date Taking? Authorizing Provider  ?amoxicillin-clavulanate (AUGMENTIN) 875-125 MG tablet Take 1 tablet by mouth every 12 (twelve) hours. 02/22/22  Yes Wynetta FinesMessick, Emmanuela Ghazi C, MD  ?albuterol (VENTOLIN HFA) 108 (90 Base) MCG/ACT inhaler Inhale 2 puffs into the lungs every 6 (six) hours as needed for wheezing or shortness of breath. 12/17/21   Everrett CoombeMatthews, Cody, DO  ?atorvastatin (LIPITOR) 80 MG tablet Take 1 tablet (80 mg total) by mouth daily. 09/23/21   Everrett CoombeMatthews, Cody, DO  ?BIOTIN PO  Take 1 tablet by mouth daily.    [provider]  ?carvedilol (COREG) 12.5 MG tablet Take 1 tablet (12.5 mg total) by mouth 2 (two) times daily with a meal. 09/23/21   Everrett CoombeMatthews, Cody, DO  ?diclofenac Sodium (VOLTAREN) 1 % GEL Apply 4 g topically 4 (four) times daily as needed. 12/02/20   Everrett CoombeMatthews, Cody, DO  ?empagliflozin (JARDIANCE) 25 MG TABS tablet Take 1 tablet (25 mg total) by mouth daily. 12/17/21   Everrett CoombeMatthews, Cody, DO  ?glucose blood (FREESTYLE TEST STRIPS) test strip Use as instructed 12/02/21   Everrett CoombeMatthews, Cody, DO  ?Insulin Glargine (BASAGLAR KWIKPEN) 100 UNIT/ML Inject 25 Units into the skin daily. 12/17/21 03/17/22  Everrett CoombeMatthews, Cody, DO  ?Insulin Pen Needle (BD PEN NEEDLE MICRO U/F) 32G X 6 MM MISC Use as directed one time daily 02/03/22   Everrett CoombeMatthews, Cody, DO  ?Lancets (FREESTYLE) lancets Use as instructed 03/02/21   Everrett CoombeMatthews, Cody, DO  ?lisinopril (ZESTRIL) 40 MG tablet Take 1 tablet (40 mg total) by mouth daily. 01/21/22 04/21/22  Everrett CoombeMatthews, Cody, DO  ?metFORMIN (GLUCOPHAGE) 500 MG tablet Take 1 tablet (500 mg total) by mouth 2 (two) times daily with a meal. 12/17/21   Everrett CoombeMatthews, Cody, DO  ?pantoprazole (PROTONIX) 40 MG tablet Take 1 tablet (40 mg total) by mouth daily. 12/17/21   Everrett CoombeMatthews, Cody, DO  ?rivaroxaban (XARELTO) 20 MG TABS tablet Take 1 tablet (20 mg total) by mouth daily with supper. 12/17/21   Everrett CoombeMatthews, Cody, DO  ?Semaglutide,0.25 or 0.5MG /DOS, (OZEMPIC, 0.25 OR 0.5 MG/DOSE,) 2  MG/1.5ML SOPN Inject 0.25mg  weekly x1 month then increase to 0.5mg  weekly 12/17/21   Everrett Coombe, DO  ?sildenafil (VIAGRA) 100 MG tablet Take 0.5-1 tablets (50-100 mg total) by mouth daily as needed for erectile dysfunction. 09/23/21   Everrett Coombe, DO  ?traZODone (DESYREL) 50 MG tablet Take 1-2 tablets (50-100 mg total) by mouth at bedtime as needed for sleep. 05/22/20   Everrett Coombe, DO  ?   ? ?Allergies    ?Patient has no known allergies.   ? ?Review of Systems   ?Review of Systems  ?Neurological:  Positive for dizziness.   ?All other systems reviewed and are negative. ? ?Physical Exam ?Updated Vital Signs ?BP (!) 168/106   Pulse 95   Temp 98.1 ?F (36.7 ?C)   Resp 17   SpO2 98%  ?Physical Exam ?Vitals and nursing note reviewed.  ?Constitutional:   ?   General: He is not in acute distress. ?   Appearance: Normal appearance. He is well-developed.  ?HENT:  ?   Head: Normocephalic and atraumatic.  ?   Mouth/Throat:  ?   Comments: Diffuse dental decay. ? ?Tenderness located along the right lower jaw where multiple teeth have advanced decay.  No discrete evidence of dental abscess noted. ?Eyes:  ?   Conjunctiva/sclera: Conjunctivae normal.  ?   Pupils: Pupils are equal, round, and reactive to light.  ?Cardiovascular:  ?   Rate and Rhythm: Normal rate and regular rhythm.  ?   Heart sounds: Normal heart sounds.  ?Pulmonary:  ?   Effort: Pulmonary effort is normal. No respiratory distress.  ?   Breath sounds: Normal breath sounds.  ?Abdominal:  ?   General: There is no distension.  ?   Palpations: Abdomen is soft.  ?   Tenderness: There is no abdominal tenderness.  ?Musculoskeletal:     ?   General: No deformity. Normal range of motion.  ?   Cervical back: Normal range of motion and neck supple.  ?Skin: ?   General: Skin is warm and dry.  ?Neurological:  ?   General: No focal deficit present.  ?   Mental Status: He is alert and oriented to person, place, and time. Mental status is at baseline.  ?   Cranial Nerves: No cranial nerve deficit.  ?   Sensory: No sensory deficit.  ?   Motor: No weakness.  ?   Coordination: Coordination normal.  ?   Gait: Gait normal.  ? ? ?ED Results / Procedures / Treatments   ?Labs ?(all labs ordered are listed, but only abnormal results are displayed) ?Labs Reviewed  ?COMPREHENSIVE METABOLIC PANEL - Abnormal; Notable for the following components:  ?    Result Value  ? Potassium 3.3 (*)   ? Glucose, Bld 115 (*)   ? All other components within normal limits  ?URINALYSIS, ROUTINE W REFLEX MICROSCOPIC - Abnormal;  Notable for the following components:  ? Specific Gravity, Urine 1.032 (*)   ? Glucose, UA >=500 (*)   ? Ketones, ur 20 (*)   ? All other components within normal limits  ?CBG MONITORING, ED - Abnormal; Notable for the following components:  ? Glucose-Capillary 117 (*)   ? All other components within normal limits  ?CBC WITH DIFFERENTIAL/PLATELET  ?TROPONIN I (HIGH SENSITIVITY)  ?TROPONIN I (HIGH SENSITIVITY)  ? ? ?EKG ?EKG Interpretation ? ?Date/Time:  Monday February 22 2022 12:43:17 EDT ?Ventricular Rate:  85 ?PR Interval:  176 ?QRS Duration: 78 ?QT Interval:  358 ?QTC Calculation:  426 ?R Axis:   -9 ?Text Interpretation: Sinus rhythm with marked sinus arrhythmia Minimal voltage criteria for LVH, may be normal variant ( R in aVL ) Cannot rule out Anterior infarct , age undetermined Abnormal ECG No previous ECGs available Confirmed by Kristine Royal [phone]) on 02/22/2022 8:57:09 PM ? ?Radiology ?DG Chest 2 View ? ?Result Date: 02/22/2022 ?CLINICAL DATA:  Shortness of breath. EXAM: CHEST - 2 VIEW COMPARISON:  August 16, 2019. FINDINGS: No consolidation. No visible pleural effusions or pneumothorax. Cardiomediastinal silhouette is within normal limits and similar to the prior. No displaced fracture. Degenerative changes of the spine. IMPRESSION: No evidence of acute cardiopulmonary disease. Electronically Signed   By: Feliberto Harts M.D.   On: 02/22/2022 13:02   ? ?Procedures ?Procedures  ? ? ?Medications Ordered in ED ?Medications  ?amoxicillin-clavulanate (AUGMENTIN) 875-125 MG per tablet 1 tablet (1 tablet Oral Given 02/22/22 2115)  ? ? ?ED Course/ Medical Decision Making/ A&P ?  ?                        ?Medical Decision Making ?Risk ?Prescription drug management. ? ? ? ?Medical Screen Complete ? ?This patient presented to the ED with complaint of lightheadedness, dental pain, hypertension. ? ?This complaint involves an extensive number of treatment options. The initial differential diagnosis includes, but is not  limited to, dental infection, poorly controlled hypertension, metabolic abnormality, etc. ? ?This presentation is: Acute, Self-Limited, Previously Undiagnosed, Uncertain Prognosis, Complicated, Systemic S

## 2022-02-23 ENCOUNTER — Telehealth: Payer: Self-pay | Admitting: General Practice

## 2022-02-23 NOTE — Telephone Encounter (Signed)
Transition Care Management Unsuccessful Follow-up Telephone Call ? ?Date of discharge and from where:  02/22/22 from [facility] ? ?Attempts:  1st Attempt ? ?Reason for unsuccessful TCM follow-up call:  Left voice message for patient to call back and schedule a three day hospital follow up (discharge date 02/22/22).  ? ?  ?

## 2022-02-24 NOTE — Telephone Encounter (Signed)
Transition Care Management Unsuccessful Follow-up Telephone Call ? ?Date of discharge and from where:  02/22/22 from [facility] ? ?Attempts:  2nd Attempt ? ?Reason for unsuccessful TCM follow-up call:  Left voice message for patient to call back and schedule a three day hospital follow up (discharge date 02/22/22). ? ?  ?

## 2022-03-02 ENCOUNTER — Ambulatory Visit (INDEPENDENT_AMBULATORY_CARE_PROVIDER_SITE_OTHER): Payer: BC Managed Care – PPO | Admitting: Family Medicine

## 2022-03-02 ENCOUNTER — Encounter: Payer: Self-pay | Admitting: Family Medicine

## 2022-03-02 DIAGNOSIS — I10 Essential (primary) hypertension: Secondary | ICD-10-CM | POA: Diagnosis not present

## 2022-03-02 DIAGNOSIS — E1140 Type 2 diabetes mellitus with diabetic neuropathy, unspecified: Secondary | ICD-10-CM | POA: Diagnosis not present

## 2022-03-02 NOTE — Assessment & Plan Note (Signed)
BP improved today.  Discussed importance of taking medication on a regular basis.  He expresses understanding.   ?

## 2022-03-02 NOTE — Patient Instructions (Signed)
[email or web address]

## 2022-03-02 NOTE — Telephone Encounter (Signed)
Transition Care Management Follow-up Telephone Call ?Date of discharge and from where: 02/22/22 from [facility] [place] ?How have you been since you were released from the hospital? Patient has an appt today with PCP.  ?Any questions or concerns? No ? ? ?
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
## 2022-03-02 NOTE — Assessment & Plan Note (Signed)
Reported blood sugars at home seem much better.  Doing well with Ozempic and will continue.  Given information about co-pay card for jardiance.  Has f/u in June for DM ?

## 2022-03-18 ENCOUNTER — Other Ambulatory Visit: Payer: Self-pay

## 2022-03-18 MED ORDER — OZEMPIC (0.25 OR 0.5 MG/DOSE) 2 MG/1.5ML ~~LOC~~ SOPN: PEN_INJECTOR | SUBCUTANEOUS | 1 refills | Status: DC

## 2022-04-26 ENCOUNTER — Ambulatory Visit: Payer: BC Managed Care – PPO | Admitting: Family Medicine

## 2022-05-12 ENCOUNTER — Other Ambulatory Visit: Payer: Self-pay | Admitting: Family Medicine

## 2022-06-04 ENCOUNTER — Ambulatory Visit: Payer: BC Managed Care – PPO | Admitting: Family Medicine

## 2022-06-18 ENCOUNTER — Ambulatory Visit (INDEPENDENT_AMBULATORY_CARE_PROVIDER_SITE_OTHER): Payer: BC Managed Care – PPO | Admitting: Family Medicine

## 2022-06-18 ENCOUNTER — Encounter: Payer: Self-pay | Admitting: Family Medicine

## 2022-06-18 VITALS — BP 171/92 | HR 77 | Ht 70.0 in | Wt 220.0 lb

## 2022-06-18 DIAGNOSIS — E1140 Type 2 diabetes mellitus with diabetic neuropathy, unspecified: Secondary | ICD-10-CM

## 2022-06-18 DIAGNOSIS — I480 Paroxysmal atrial fibrillation: Secondary | ICD-10-CM | POA: Diagnosis not present

## 2022-06-18 DIAGNOSIS — I10 Essential (primary) hypertension: Secondary | ICD-10-CM

## 2022-06-18 LAB — POCT GLYCOSYLATED HEMOGLOBIN (HGB A1C): HbA1c, POC (prediabetic range): 7.6 % — AB (ref 5.7–6.4)

## 2022-06-18 MED ORDER — LISINOPRIL-HYDROCHLOROTHIAZIDE 20-12.5 MG PO TABS: 2.0000 | ORAL_TABLET | Freq: Every day | ORAL | 3 refills | Status: DC

## 2022-06-18 MED ORDER — SEMAGLUTIDE (1 MG/DOSE) 4 MG/3ML ~~LOC~~ SOPN: 1.0000 mg | PEN_INJECTOR | SUBCUTANEOUS | 2 refills | Status: DC

## 2022-06-18 NOTE — Assessment & Plan Note (Signed)
Continue carvedilol and anticoagulation with xarelto.
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
## 2022-06-18 NOTE — Assessment & Plan Note (Signed)
Blood sugars are better controlled.  Increase ozempic to 1mg  weekly.  Continue all other medications.  Discussed working diet, cutting back on soda consumption.

## 2022-06-18 NOTE — Patient Instructions (Signed)
Great to see you today! Increase Ozempic to 1mg  weekly.  Stop lisinopril and start lisinopril/hctz.  Take 2 of these tablets each day.  Return in 2 weeks for BP check

## 2022-06-18 NOTE — Assessment & Plan Note (Signed)
BP elevated.  Changing to lisinopril/ hctz 2 tabs of 20/12.5mg .  Continue coreg at 12.5mg  bid.  2 week nurse visit to recheck BP.

## 2022-06-25 ENCOUNTER — Telehealth: Payer: Self-pay

## 2022-06-25 DIAGNOSIS — E1140 Type 2 diabetes mellitus with diabetic neuropathy, unspecified: Secondary | ICD-10-CM

## 2022-06-25 DIAGNOSIS — H40053 Ocular hypertension, bilateral: Secondary | ICD-10-CM

## 2022-06-25 NOTE — Telephone Encounter (Signed)
Pt lvm stating new medications were making him feel bad. He's not sure which medication is causing the issue. Roger Fisher stated that Dr. Ashley Royalty did advise that he may experience body changes due to the medication switch.   Pt has been advised to continue medication. If he feels worse, stop medication. Dr. Ashley Royalty has been advised and we will callback with the response.

## 2022-06-30 NOTE — Telephone Encounter (Signed)
Pt states he was referring to Lisinopril-HCTZ. He is now taking the medication at night and feels better.   States he was to receive a referral to Opthalomology due to increased headaches. Please advise.

## 2022-06-30 NOTE — Telephone Encounter (Signed)
Referral entered  

## 2022-07-02 ENCOUNTER — Ambulatory Visit (INDEPENDENT_AMBULATORY_CARE_PROVIDER_SITE_OTHER): Payer: BC Managed Care – PPO | Admitting: Family Medicine

## 2022-07-02 VITALS — BP 131/72 | HR 83 | Temp 98.6°F | Ht 71.0 in | Wt 214.1 lb

## 2022-07-02 DIAGNOSIS — I10 Essential (primary) hypertension: Secondary | ICD-10-CM

## 2022-07-02 NOTE — Progress Notes (Signed)
Patient is here for blood pressure check. Denies trouble sleeping, palpitations, dizziness, lightheadedness, blurry vision, chest pain, shortness of breath, headaches and/or medication problems.   Patient's blood pressure was within of goal range. Per patient, he reduced the lisinopril/HCTZ to one tab daily. Patient mentioned feeling very dizzy and falling when taking 2 tabs. Patient is going to restart taking 2 tabs this weekend since he is off from work. Patient informed to call Triage if he experiences any dizziness/falling again. Agreeable with plan.    Patient informed to schedule next appointment as directed by provider.

## 2022-07-02 NOTE — Progress Notes (Signed)
Medical screening examination/treatment was performed by qualified clinical staff member and as supervising physician I was immediately available for consultation/collaboration. I have reviewed documentation and agree with assessment and plan.  Tehila Sokolow, DO  

## 2022-08-27 ENCOUNTER — Encounter: Payer: Self-pay | Admitting: Family Medicine

## 2022-08-27 ENCOUNTER — Telehealth: Payer: BC Managed Care – PPO | Admitting: Physician Assistant

## 2022-08-27 DIAGNOSIS — K047 Periapical abscess without sinus: Secondary | ICD-10-CM | POA: Diagnosis not present

## 2022-08-27 MED ORDER — AMOXICILLIN 500 MG PO CAPS: 500.0000 mg | ORAL_CAPSULE | Freq: Three times a day (TID) | ORAL | 0 refills | Status: AC

## 2022-08-27 NOTE — Progress Notes (Signed)
E-Visit for Dental Pain  We are sorry that you are not feeling well.  Here is how we plan to help!  Based on what you have shared with me in the questionnaire, it sounds like you have a dental infection.  Amoxicillin 500mg  3 times per day for 10 days  It is imperative that you see a dentist within 10 days of this eVisit to determine the cause of the dental pain and be sure it is adequately treated  A toothache or tooth pain is caused when the nerve in the root of a tooth or surrounding a tooth is irritated. Dental (tooth) infection, decay, injury, or loss of a tooth are the most common causes of dental pain. Pain may also occur after an extraction (tooth is pulled out). Pain sometimes originates from other areas and radiates to the jaw, thus appearing to be tooth pain.Bacteria growing inside your mouth can contribute to gum disease and dental decay, both of which can cause pain. A toothache occurs from inflammation of the central portion of the tooth called pulp. The pulp contains nerve endings that are very sensitive to pain. Inflammation to the pulp or pulpitis may be caused by dental cavities, trauma, and infection.    HOME CARE:   For toothaches: Over-the-counter pain medications such as acetaminophen or ibuprofen may be used. Take these as directed on the package while you arrange for a dental appointment. Avoid very cold or hot foods, because they may make the pain worse. You may get relief from biting on a cotton ball soaked in oil of cloves. You can get oil of cloves at most drug stores.  For jaw pain:  Aspirin may be helpful for problems in the joint of the jaw in adults. If pain happens every time you open your mouth widely, the temporomandibular joint (TMJ) may be the source of the pain. Yawning or taking a large bite of food may worsen the pain. An appointment with your doctor or dentist will help you find the cause.     GET HELP RIGHT AWAY IF:  You have a high fever or  chills If you have had a recent head or face injury and develop headache, light headedness, nausea, vomiting, or other symptoms that concern you after an injury to your face or mouth, you could have a more serious injury in addition to your dental injury. A facial rash associated with a toothache: This condition may improve with medication. Contact your doctor for them to decide what is appropriate. Any jaw pain occurring with chest pain: Although jaw pain is most commonly caused by dental disease, it is sometimes referred pain from other areas. People with heart disease, especially people who have had stents placed, people with diabetes, or those who have had heart surgery may have jaw pain as a symptom of heart attack or angina. If your jaw or tooth pain is associated with lightheadedness, sweating, or shortness of breath, you should see a doctor as soon as possible. Trouble swallowing or excessive pain or bleeding from gums: If you have a history of a weakened immune system, diabetes, or steroid use, you may be more susceptible to infections. Infections can often be more severe and extensive or caused by unusual organisms. Dental and gum infections in people with these conditions may require more aggressive treatment. An abscess may need draining or IV antibiotics, for example.  MAKE SURE YOU   Understand these instructions. Will watch your condition. Will get help right away if you  are not doing well or get worse.  Thank you for choosing an e-visit.  Your e-visit answers were reviewed by a board certified advanced clinical practitioner to complete your personal care plan. Depending upon the condition, your plan could have included both over the counter or prescription medications.  Please review your pharmacy choice. Make sure the pharmacy is open so you can pick up prescription now. If there is a problem, you may contact your provider through MyChart messaging and have the prescription routed to  another pharmacy.  Your safety is important to us. If you have drug allergies check your prescription carefully.   For the next 24 hours you can use MyChart to ask questions about today's visit, request a non-urgent call back, or ask for a work or school excuse. You will get an email in the next two days asking about your experience. I hope that your e-visit has been valuable and will speed your recovery.  I have spent 5 minutes in review of e-visit questionnaire, review and updating patient chart, medical decision making and response to patient.   Jabir Dahlem M Emylia Latella, PA-C  

## 2022-09-22 ENCOUNTER — Ambulatory Visit: Payer: BC Managed Care – PPO | Admitting: Family Medicine

## 2022-10-03 ENCOUNTER — Other Ambulatory Visit: Payer: Self-pay | Admitting: Family Medicine

## 2022-10-29 ENCOUNTER — Other Ambulatory Visit: Payer: Self-pay

## 2022-10-29 DIAGNOSIS — E1169 Type 2 diabetes mellitus with other specified complication: Secondary | ICD-10-CM

## 2022-10-29 MED ORDER — ATORVASTATIN CALCIUM 80 MG PO TABS: 80.0000 mg | ORAL_TABLET | Freq: Every day | ORAL | 3 refills | Status: DC

## 2022-11-24 ENCOUNTER — Other Ambulatory Visit: Payer: Self-pay

## 2022-11-24 DIAGNOSIS — N529 Male erectile dysfunction, unspecified: Secondary | ICD-10-CM

## 2022-11-24 MED ORDER — SILDENAFIL CITRATE 100 MG PO TABS: 50.0000 mg | ORAL_TABLET | Freq: Every day | ORAL | 6 refills | Status: DC | PRN

## 2022-12-09 ENCOUNTER — Ambulatory Visit (INDEPENDENT_AMBULATORY_CARE_PROVIDER_SITE_OTHER): Payer: BC Managed Care – PPO | Admitting: Family Medicine

## 2022-12-09 ENCOUNTER — Encounter: Payer: Self-pay | Admitting: Family Medicine

## 2022-12-09 VITALS — BP 111/74 | HR 89 | Ht 71.0 in | Wt 209.4 lb

## 2022-12-09 DIAGNOSIS — E1169 Type 2 diabetes mellitus with other specified complication: Secondary | ICD-10-CM | POA: Diagnosis not present

## 2022-12-09 DIAGNOSIS — E1140 Type 2 diabetes mellitus with diabetic neuropathy, unspecified: Secondary | ICD-10-CM

## 2022-12-09 DIAGNOSIS — N529 Male erectile dysfunction, unspecified: Secondary | ICD-10-CM | POA: Diagnosis not present

## 2022-12-09 DIAGNOSIS — E785 Hyperlipidemia, unspecified: Secondary | ICD-10-CM

## 2022-12-09 DIAGNOSIS — E119 Type 2 diabetes mellitus without complications: Secondary | ICD-10-CM | POA: Diagnosis not present

## 2022-12-09 DIAGNOSIS — I480 Paroxysmal atrial fibrillation: Secondary | ICD-10-CM

## 2022-12-09 DIAGNOSIS — I10 Essential (primary) hypertension: Secondary | ICD-10-CM

## 2022-12-09 LAB — POCT GLYCOSYLATED HEMOGLOBIN (HGB A1C): HbA1c, POC (controlled diabetic range): 12.9 % — AB (ref 0.0–7.0)

## 2022-12-09 LAB — POCT UA - MICROALBUMIN
Creatinine, POC: 10 mg/dL
Microalbumin Ur, POC: 10 mg/L

## 2022-12-09 MED ORDER — FREESTYLE TEST VI STRP: ORAL_STRIP | 12 refills | Status: DC

## 2022-12-09 MED ORDER — ATORVASTATIN CALCIUM 80 MG PO TABS: 80.0000 mg | ORAL_TABLET | Freq: Every day | ORAL | 3 refills | Status: DC

## 2022-12-09 MED ORDER — CARVEDILOL 12.5 MG PO TABS: 12.5000 mg | ORAL_TABLET | Freq: Two times a day (BID) | ORAL | 2 refills | Status: DC

## 2022-12-09 MED ORDER — EMPAGLIFLOZIN 25 MG PO TABS: 25.0000 mg | ORAL_TABLET | Freq: Every day | ORAL | 2 refills | Status: DC

## 2022-12-09 MED ORDER — LISINOPRIL-HYDROCHLOROTHIAZIDE 20-12.5 MG PO TABS: 2.0000 | ORAL_TABLET | Freq: Every day | ORAL | 2 refills | Status: DC

## 2022-12-09 MED ORDER — METFORMIN HCL 500 MG PO TABS: 500.0000 mg | ORAL_TABLET | Freq: Two times a day (BID) | ORAL | 2 refills | Status: DC

## 2022-12-09 MED ORDER — BASAGLAR KWIKPEN 100 UNIT/ML ~~LOC~~ SOPN: 25.0000 [IU] | PEN_INJECTOR | Freq: Every day | SUBCUTANEOUS | 2 refills | Status: DC

## 2022-12-09 MED ORDER — SEMAGLUTIDE (1 MG/DOSE) 4 MG/3ML ~~LOC~~ SOPN: 1.0000 mg | PEN_INJECTOR | SUBCUTANEOUS | 1 refills | Status: DC

## 2022-12-09 MED ORDER — PANTOPRAZOLE SODIUM 40 MG PO TBEC: 40.0000 mg | DELAYED_RELEASE_TABLET | Freq: Every day | ORAL | 2 refills | Status: DC

## 2022-12-09 MED ORDER — SEMAGLUTIDE(0.25 OR 0.5MG/DOS) 2 MG/1.5ML ~~LOC~~ SOPN: PEN_INJECTOR | SUBCUTANEOUS | 1 refills | Status: DC

## 2022-12-09 MED ORDER — SILDENAFIL CITRATE 100 MG PO TABS: 50.0000 mg | ORAL_TABLET | Freq: Every day | ORAL | 6 refills | Status: DC | PRN

## 2022-12-09 MED ORDER — RIVAROXABAN 20 MG PO TABS: ORAL_TABLET | ORAL | 2 refills | Status: DC

## 2022-12-09 NOTE — Assessment & Plan Note (Signed)
Sildenafil renewed
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
## 2022-12-09 NOTE — Assessment & Plan Note (Signed)
Tolerating atorvastatin well at current strength.   

## 2022-12-09 NOTE — Assessment & Plan Note (Signed)
Blood pressure is well-controlled at this time.  Recommend continuation of current medications for management of hypertension.   

## 2022-12-09 NOTE — Assessment & Plan Note (Signed)
Diabetes is poorly controlled.  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  Dietary changes recommended including more consistency with male intake throughout the day.

## 2022-12-09 NOTE — Assessment & Plan Note (Signed)
Continue Coreg at current strength as well as Eliquis for anticoagulation.

## 2023-02-22 ENCOUNTER — Encounter: Payer: Self-pay | Admitting: Family Medicine

## 2023-03-09 ENCOUNTER — Ambulatory Visit (INDEPENDENT_AMBULATORY_CARE_PROVIDER_SITE_OTHER): Payer: BC Managed Care – PPO | Admitting: Family Medicine

## 2023-03-09 ENCOUNTER — Encounter: Payer: Self-pay | Admitting: Family Medicine

## 2023-03-09 VITALS — BP 133/79 | HR 95 | Ht 71.0 in | Wt 206.0 lb

## 2023-03-09 DIAGNOSIS — I10 Essential (primary) hypertension: Secondary | ICD-10-CM | POA: Diagnosis not present

## 2023-03-09 DIAGNOSIS — E785 Hyperlipidemia, unspecified: Secondary | ICD-10-CM

## 2023-03-09 DIAGNOSIS — R5383 Other fatigue: Secondary | ICD-10-CM

## 2023-03-09 DIAGNOSIS — E1140 Type 2 diabetes mellitus with diabetic neuropathy, unspecified: Secondary | ICD-10-CM | POA: Diagnosis not present

## 2023-03-09 DIAGNOSIS — Z794 Long term (current) use of insulin: Secondary | ICD-10-CM

## 2023-03-09 DIAGNOSIS — Z7984 Long term (current) use of oral hypoglycemic drugs: Secondary | ICD-10-CM

## 2023-03-09 DIAGNOSIS — E1149 Type 2 diabetes mellitus with other diabetic neurological complication: Secondary | ICD-10-CM

## 2023-03-09 DIAGNOSIS — I480 Paroxysmal atrial fibrillation: Secondary | ICD-10-CM

## 2023-03-09 DIAGNOSIS — Z1211 Encounter for screening for malignant neoplasm of colon: Secondary | ICD-10-CM

## 2023-03-09 DIAGNOSIS — E1169 Type 2 diabetes mellitus with other specified complication: Secondary | ICD-10-CM

## 2023-03-09 LAB — POCT GLYCOSYLATED HEMOGLOBIN (HGB A1C): HbA1c, POC (controlled diabetic range): 7 % (ref 0.0–7.0)

## 2023-03-09 MED ORDER — SEMAGLUTIDE (2 MG/DOSE) 8 MG/3ML ~~LOC~~ SOPN: 2.0000 mg | PEN_INJECTOR | SUBCUTANEOUS | 1 refills | Status: DC

## 2023-03-09 NOTE — Assessment & Plan Note (Signed)
Continue atorvastatin.   Update lipid panel.  

## 2023-03-09 NOTE — Assessment & Plan Note (Signed)
Blood sugars are much better.  Ok to remain off of insulin.  Increase to Ozempic to 2mg .  Provided with jardiance coupon.

## 2023-03-09 NOTE — Patient Instructions (Signed)
Increase Ozempic to 2mg  weekly.  Continue dietary changes.  See me again in 4 months.
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
## 2023-03-09 NOTE — Assessment & Plan Note (Signed)
BP remains well controlled.  Continue current medications for management of HTN.  Update labs today.

## 2023-03-09 NOTE — Assessment & Plan Note (Signed)
This has remained well controlled.  Continue carvedilol.  Continue xarelto for anticoagulation.

## 2023-03-10 LAB — CBC WITH DIFFERENTIAL/PLATELET
Absolute Monocytes: 442 cells/uL (ref 200–950)
Basophils Absolute: 41 cells/uL (ref 0–200)
Basophils Relative: 0.6 %
Eosinophils Absolute: 110 cells/uL (ref 15–500)
Eosinophils Relative: 1.6 %
HCT: 40 % (ref 38.5–50.0)
Hemoglobin: 14.1 g/dL (ref 13.2–17.1)
Lymphs Abs: 2015 cells/uL (ref 850–3900)
MCH: 30.5 pg (ref 27.0–33.0)
MCHC: 35.3 g/dL (ref 32.0–36.0)
MCV: 86.4 fL (ref 80.0–100.0)
MPV: 11.8 fL (ref 7.5–12.5)
Monocytes Relative: 6.4 %
Neutro Abs: 4292 cells/uL (ref 1500–7800)
Neutrophils Relative %: 62.2 %
Platelets: 259 10*3/uL (ref 140–400)
RBC: 4.63 10*6/uL (ref 4.20–5.80)
RDW: 13.3 % (ref 11.0–15.0)
Total Lymphocyte: 29.2 %
WBC: 6.9 10*3/uL (ref 3.8–10.8)

## 2023-03-10 LAB — COMPLETE METABOLIC PANEL WITH GFR
AG Ratio: 1.4 (calc) (ref 1.0–2.5)
ALT: 15 U/L (ref 9–46)
AST: 19 U/L (ref 10–35)
Albumin: 4.1 g/dL (ref 3.6–5.1)
Alkaline phosphatase (APISO): 61 U/L (ref 35–144)
BUN: 13 mg/dL (ref 7–25)
CO2: 26 mmol/L (ref 20–32)
Calcium: 9.4 mg/dL (ref 8.6–10.3)
Chloride: 102 mmol/L (ref 98–110)
Creat: 1.12 mg/dL (ref 0.70–1.30)
Globulin: 3 g/dL (calc) (ref 1.9–3.7)
Glucose, Bld: 120 mg/dL — ABNORMAL HIGH (ref 65–99)
Potassium: 3.7 mmol/L (ref 3.5–5.3)
Sodium: 142 mmol/L (ref 135–146)
Total Bilirubin: 0.5 mg/dL (ref 0.2–1.2)
Total Protein: 7.1 g/dL (ref 6.1–8.1)
eGFR: 77 mL/min/{1.73_m2} (ref >=60)

## 2023-03-10 LAB — LIPID PANEL W/REFLEX DIRECT LDL
Cholesterol: 121 mg/dL (ref <200)
HDL: 35 mg/dL — ABNORMAL LOW (ref >=40)
LDL Cholesterol (Calc): 63 mg/dL (calc)
Non-HDL Cholesterol (Calc): 86 mg/dL (calc) (ref <130)
Total CHOL/HDL Ratio: 3.5 (calc) (ref <5.0)
Triglycerides: 144 mg/dL (ref <150)

## 2023-03-10 LAB — VITAMIN B12: Vitamin B-12: 823 pg/mL (ref 200–1100)

## 2023-03-10 LAB — VITAMIN D 25 HYDROXY (VIT D DEFICIENCY, FRACTURES): Vit D, 25-Hydroxy: 18 ng/mL — ABNORMAL LOW (ref 30–100)

## 2023-03-10 LAB — TSH: TSH: 0.64 mIU/L (ref 0.40–4.50)

## 2023-04-25 ENCOUNTER — Encounter: Payer: Self-pay | Admitting: Family Medicine

## 2023-04-25 ENCOUNTER — Telehealth (INDEPENDENT_AMBULATORY_CARE_PROVIDER_SITE_OTHER): Payer: BC Managed Care – PPO | Admitting: Family Medicine

## 2023-04-25 DIAGNOSIS — K047 Periapical abscess without sinus: Secondary | ICD-10-CM | POA: Diagnosis not present

## 2023-04-25 MED ORDER — AMOXICILLIN-POT CLAVULANATE 875-125 MG PO TABS
1.0000 | ORAL_TABLET | Freq: Two times a day (BID) | ORAL | 0 refills | Status: AC
Start: 2023-04-25 — End: 2023-05-02

## 2023-04-25 NOTE — Assessment & Plan Note (Signed)
-   pt presents via telehealth visit. Has a hx of tooth infection in the past secondary to wisdom tooth infection. Pt is also a diabetic and at risk for infection. Will go ahead and give augmentin. Have discussed with patient that he needs to get his tooth pulled and pt is agreeable to contacting his dentist

## 2023-04-25 NOTE — Progress Notes (Signed)
Established patient visit   Patient: Roger Fisher   DOB: 03-29-1966   57 y.o. Male  MRN: [id number] Visit Date: 04/25/2023  Today's healthcare provider: Charlton Amor, DO   Chief Complaint  Patient presents with   Dental Pain    3 days pain by wisdom tooth 7   I connected with  [name] on 04/25/23 by a video and audio enabled telemedicine application and verified that I am speaking with the correct person using two identifiers.  Patient Location: Home  Provider Location: Office/Clinic  I discussed the limitations of evaluation and management by telemedicine. The patient expressed understanding and agreed to proceed.   SUBJECTIVE    Chief Complaint  Patient presents with   Dental Pain    3 days pain by wisdom tooth 7   HPI  Pt presents via video visit for tooth pain. Has a hx of wisdom tooth infection  Review of Systems  Constitutional:  Negative for activity change, fatigue and fever.  HENT:         Tooth pain  Respiratory:  Negative for cough and shortness of breath.   Cardiovascular:  Negative for chest pain.  Gastrointestinal:  Negative for abdominal pain.  Genitourinary:  Negative for difficulty urinating.       Current Meds  Medication Sig   albuterol (VENTOLIN HFA) 108 (90 Base) MCG/ACT inhaler Inhale 2 puffs into the lungs every 6 (six) hours as needed for wheezing or shortness of breath.   amoxicillin-clavulanate (AUGMENTIN) 875-125 MG tablet Take 1 tablet by mouth 2 (two) times daily for 7 days.   atorvastatin (LIPITOR) 80 MG tablet Take 1 tablet (80 mg total) by mouth daily.   BIOTIN PO Take 1 tablet by mouth daily.   carvedilol (COREG) 12.5 MG tablet Take 1 tablet (12.5 mg total) by mouth 2 (two) times daily with a meal.   diclofenac Sodium (VOLTAREN) 1 % GEL Apply 4 g topically 4 (four) times daily as needed.   empagliflozin (JARDIANCE) 25 MG TABS tablet Take 1 tablet (25 mg total) by mouth daily.   glucose blood (FREESTYLE TEST STRIPS) test  strip Use as instructed   Insulin Pen Needle (BD PEN NEEDLE MICRO U/F) 32G X 6 MM MISC Use as directed one time daily   Lancets (FREESTYLE) lancets Use as instructed   lisinopril-hydrochlorothiazide (ZESTORETIC) 20-12.5 MG tablet Take 2 tablets by mouth daily.   metFORMIN (GLUCOPHAGE) 500 MG tablet Take 1 tablet (500 mg total) by mouth 2 (two) times daily with a meal.   pantoprazole (PROTONIX) 40 MG tablet Take 1 tablet (40 mg total) by mouth daily.   rivaroxaban (XARELTO) 20 MG TABS tablet TAKE ONE TABLET BY MOUTH EVERY EVENING WITH DINNER   Semaglutide, 2 MG/DOSE, 8 MG/3ML SOPN Inject 2 mg as directed once a week.   sildenafil (VIAGRA) 100 MG tablet Take 0.5-1 tablets (50-100 mg total) by mouth daily as needed for erectile dysfunction.    OBJECTIVE    There were no vitals taken for this visit.  Physical Exam Vitals and nursing note reviewed.  Constitutional:      General: He is not in acute distress.    Appearance: Normal appearance.  HENT:     Head: Normocephalic and atraumatic.     Right Ear: External ear normal.     Left Ear: External ear normal.     Nose: Nose normal.  Eyes:     Conjunctiva/sclera: Conjunctivae normal.  Neurological:     Mental Status:  He is alert.        ASSESSMENT & PLAN    Problem List Items Addressed This Visit       Digestive   Tooth infection - Primary    - pt presents via telehealth visit. Has a hx of tooth infection in the past secondary to wisdom tooth infection. Pt is also a diabetic and at risk for infection. Will go ahead and give augmentin. Have discussed with patient that he needs to get his tooth pulled and pt is agreeable to contacting his dentist      Relevant Medications   amoxicillin-clavulanate (AUGMENTIN) 875-125 MG tablet    No follow-ups on file.      Meds ordered this encounter  Medications   amoxicillin-clavulanate (AUGMENTIN) 875-125 MG tablet    Sig: Take 1 tablet by mouth 2 (two) times daily for 7 days.     Dispense:  14 tablet    Refill:  0    No orders of the defined types were placed in this encounter.    Charlton Amor, DO  [facility] at [facility] [phone] (phone) [phone] (fax)  [facility]

## 2023-05-24 ENCOUNTER — Other Ambulatory Visit: Payer: Self-pay | Admitting: Family Medicine

## 2023-05-24 DIAGNOSIS — E119 Type 2 diabetes mellitus without complications: Secondary | ICD-10-CM

## 2023-06-06 ENCOUNTER — Ambulatory Visit: Payer: BC Managed Care – PPO | Admitting: Family Medicine

## 2023-07-11 ENCOUNTER — Ambulatory Visit (INDEPENDENT_AMBULATORY_CARE_PROVIDER_SITE_OTHER): Payer: BC Managed Care – PPO | Admitting: Family Medicine

## 2023-07-11 ENCOUNTER — Encounter: Payer: Self-pay | Admitting: Family Medicine

## 2023-07-11 VITALS — BP 147/81 | HR 86 | Ht 71.0 in | Wt 208.0 lb

## 2023-07-11 DIAGNOSIS — E785 Hyperlipidemia, unspecified: Secondary | ICD-10-CM

## 2023-07-11 DIAGNOSIS — E1140 Type 2 diabetes mellitus with diabetic neuropathy, unspecified: Secondary | ICD-10-CM | POA: Diagnosis not present

## 2023-07-11 DIAGNOSIS — E1169 Type 2 diabetes mellitus with other specified complication: Secondary | ICD-10-CM | POA: Diagnosis not present

## 2023-07-11 DIAGNOSIS — Z1211 Encounter for screening for malignant neoplasm of colon: Secondary | ICD-10-CM

## 2023-07-11 DIAGNOSIS — I480 Paroxysmal atrial fibrillation: Secondary | ICD-10-CM

## 2023-07-11 DIAGNOSIS — I10 Essential (primary) hypertension: Secondary | ICD-10-CM | POA: Diagnosis not present

## 2023-07-11 DIAGNOSIS — E119 Type 2 diabetes mellitus without complications: Secondary | ICD-10-CM

## 2023-07-11 DIAGNOSIS — Z7984 Long term (current) use of oral hypoglycemic drugs: Secondary | ICD-10-CM

## 2023-07-11 LAB — POCT GLYCOSYLATED HEMOGLOBIN (HGB A1C): HbA1c, POC (controlled diabetic range): 6.9 % (ref 0.0–7.0)

## 2023-07-11 MED ORDER — SEMAGLUTIDE (2 MG/DOSE) 8 MG/3ML ~~LOC~~ SOPN: 2.0000 mg | PEN_INJECTOR | SUBCUTANEOUS | 1 refills | Status: DC

## 2023-07-11 MED ORDER — LISINOPRIL-HYDROCHLOROTHIAZIDE 20-25 MG PO TABS: 1.0000 | ORAL_TABLET | Freq: Every day | ORAL | 3 refills | Status: DC

## 2023-07-11 MED ORDER — FREESTYLE LANCETS MISC: 12 refills | Status: DC

## 2023-07-11 MED ORDER — EMPAGLIFLOZIN 25 MG PO TABS: 25.0000 mg | ORAL_TABLET | Freq: Every day | ORAL | 2 refills | Status: DC

## 2023-07-11 MED ORDER — CARVEDILOL 12.5 MG PO TABS: 12.5000 mg | ORAL_TABLET | Freq: Two times a day (BID) | ORAL | 2 refills | Status: DC

## 2023-07-11 MED ORDER — RIVAROXABAN 20 MG PO TABS: ORAL_TABLET | ORAL | 2 refills | Status: DC

## 2023-07-11 NOTE — Assessment & Plan Note (Signed)
Continue atorvastatin.  Lab Results  Component Value Date   LDLCALC 63 03/09/2023

## 2023-07-11 NOTE — Assessment & Plan Note (Signed)
BP elevated today.  Change lisinopril/hydrochlorothiazide to 20/25mg  strength as this appears cheaper.  Continue current medications for management of HTN.
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
## 2023-07-11 NOTE — Assessment & Plan Note (Signed)
Blood sugars are looking pretty good.  Continue with Ozempic 2mg , coupon provided.   Provided with jardiance coupon.

## 2023-07-11 NOTE — Assessment & Plan Note (Signed)
This has remained well controlled.  Continue carvedilol.  Continue xarelto for anticoagulation.

## 2023-07-26 ENCOUNTER — Ambulatory Visit (INDEPENDENT_AMBULATORY_CARE_PROVIDER_SITE_OTHER): Payer: BC Managed Care – PPO

## 2023-07-26 ENCOUNTER — Other Ambulatory Visit: Payer: Self-pay | Admitting: Family Medicine

## 2023-07-26 VITALS — BP 156/91 | HR 100 | Ht 71.0 in | Wt 208.1 lb

## 2023-07-26 DIAGNOSIS — I10 Essential (primary) hypertension: Secondary | ICD-10-CM

## 2023-07-26 MED ORDER — CARVEDILOL 25 MG PO TABS: 25.0000 mg | ORAL_TABLET | Freq: Two times a day (BID) | ORAL | 1 refills | Status: DC

## 2023-07-26 NOTE — Patient Instructions (Signed)
Patient is advised to continue to take medication PCP has upped the dosage.Patient advised to schedule next B/P check in 2 weeks.

## 2023-07-26 NOTE — Progress Notes (Signed)
Established Patient Office Visit  Subjective   Patient ID: Gloria Delamater, male    DOB: 06-27-1966  Age: 57 y.o. MRN: [id number]  Chief Complaint  Patient presents with   Hypertension    B/P check nuirse visit    HPI  Patient is here for a 2 week B/P check. Denies trouble sleeping, palpitations or medication problems.  ROS    Objective:     BP (!) 156/91   Pulse 100   Wt 208 lb 1.9 oz (94.4 kg)   SpO2 99%   BMI 29.03 kg/m    Physical Exam   No results found for any visits on 07/26/23.    The ASCVD Risk score (Arnett DK, et al., 2019) failed to calculate for the following reasons:   The valid total cholesterol range is 130 to 320 mg/dL    Assessment & Plan:  Patient is advised to continue to take medication PCP has upped the dosage.Patient advised to schedule next B/P check in 2 weeks. Problem List Items Addressed This Visit   None   Return in about 2 weeks (around 08/09/2023) for Nurse vist B/P check.    Jalicia Roszak  [place], CMA

## 2023-07-29 ENCOUNTER — Encounter: Payer: Self-pay | Admitting: Family Medicine

## 2023-07-29 MED ORDER — OZEMPIC (0.25 OR 0.5 MG/DOSE) 2 MG/3ML ~~LOC~~ SOPN: PEN_INJECTOR | SUBCUTANEOUS | 0 refills | Status: DC

## 2023-07-29 NOTE — Telephone Encounter (Signed)
Updated rx sent in.

## 2023-08-09 ENCOUNTER — Ambulatory Visit (INDEPENDENT_AMBULATORY_CARE_PROVIDER_SITE_OTHER): Payer: BC Managed Care – PPO

## 2023-08-09 VITALS — BP 126/71 | HR 80

## 2023-08-09 DIAGNOSIS — I10 Essential (primary) hypertension: Secondary | ICD-10-CM

## 2023-08-09 NOTE — Progress Notes (Signed)
   Established Patient Office Visit  Subjective   Patient ID: Roger Fisher, male    DOB: 12/21/1965  Age: 57 y.o. MRN: [id number]  Chief Complaint  Patient presents with   Hypertension    HPI  Roger Fisher is here for blood pressure check. Denies chest pain, shortness of breath or dizziness.   ROS    Objective:     BP 126/71   Pulse 80   SpO2 98%    Physical Exam   No results found for any visits on 08/09/23.    The ASCVD Risk score (Arnett DK, et al., 2019) failed to calculate for the following reasons:   The valid total cholesterol range is 130 to 320 mg/dL    Assessment & Plan:  HTN - Blood pressure within normal limits. Patient advised to continue medications as directed.   Problem List Items Addressed This Visit       Unprioritized   Essential hypertension - Primary    Return in about 3 months (around 11/09/2023) for HTN with Dr Ashley Royalty. Earna Coder, Janalyn Harder, CMA

## 2023-11-28 ENCOUNTER — Encounter: Payer: Self-pay | Admitting: Family Medicine

## 2023-11-28 ENCOUNTER — Ambulatory Visit (INDEPENDENT_AMBULATORY_CARE_PROVIDER_SITE_OTHER): Payer: 59 | Admitting: Family Medicine

## 2023-11-28 VITALS — BP 137/85 | HR 102 | Ht 71.0 in | Wt 208.0 lb

## 2023-11-28 DIAGNOSIS — R29818 Other symptoms and signs involving the nervous system: Secondary | ICD-10-CM

## 2023-11-28 DIAGNOSIS — E1169 Type 2 diabetes mellitus with other specified complication: Secondary | ICD-10-CM | POA: Diagnosis not present

## 2023-11-28 DIAGNOSIS — Z1211 Encounter for screening for malignant neoplasm of colon: Secondary | ICD-10-CM

## 2023-11-28 DIAGNOSIS — I10 Essential (primary) hypertension: Secondary | ICD-10-CM | POA: Diagnosis not present

## 2023-11-28 DIAGNOSIS — I480 Paroxysmal atrial fibrillation: Secondary | ICD-10-CM

## 2023-11-28 DIAGNOSIS — E1140 Type 2 diabetes mellitus with diabetic neuropathy, unspecified: Secondary | ICD-10-CM | POA: Diagnosis not present

## 2023-11-28 DIAGNOSIS — E785 Hyperlipidemia, unspecified: Secondary | ICD-10-CM

## 2023-11-28 DIAGNOSIS — Z7984 Long term (current) use of oral hypoglycemic drugs: Secondary | ICD-10-CM

## 2023-11-28 DIAGNOSIS — N529 Male erectile dysfunction, unspecified: Secondary | ICD-10-CM

## 2023-11-28 LAB — POCT UA - MICROALBUMIN
Creatinine, POC: 50 mg/dL
Microalbumin Ur, POC: 30 mg/L

## 2023-11-28 LAB — POCT GLYCOSYLATED HEMOGLOBIN (HGB A1C): HbA1c, POC (controlled diabetic range): 11.1 % — AB (ref 0.0–7.0)

## 2023-11-28 MED ORDER — TIRZEPATIDE 7.5 MG/0.5ML ~~LOC~~ SOAJ: 7.5000 mg | SUBCUTANEOUS | 1 refills | Status: DC

## 2023-11-28 MED ORDER — TIRZEPATIDE 5 MG/0.5ML ~~LOC~~ SOAJ: 5.0000 mg | SUBCUTANEOUS | 0 refills | Status: DC

## 2023-11-28 MED ORDER — TIRZEPATIDE 2.5 MG/0.5ML ~~LOC~~ SOAJ: 2.5000 mg | SUBCUTANEOUS | 0 refills | Status: DC

## 2023-11-28 MED ORDER — SILDENAFIL CITRATE 100 MG PO TABS: 50.0000 mg | ORAL_TABLET | Freq: Every day | ORAL | 6 refills | Status: DC | PRN

## 2023-11-28 NOTE — Assessment & Plan Note (Signed)
This has remained well controlled.  Continue carvedilol.  Continue xarelto for anticoagulation.

## 2023-11-28 NOTE — Assessment & Plan Note (Signed)
Diabetes is not well controlled.  This should improved with addition of GLP-1 again.  Will add mounjaro to replace ozempic.  Continue all other medications at this time.  Encouraged dietary changes.

## 2023-11-28 NOTE — Addendum Note (Signed)
Addended by: Ardyth Man on: 11/28/2023 11:25 AM   Modules accepted: Orders
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
## 2023-11-28 NOTE — Addendum Note (Signed)
Addended by: Ardyth Man on: 11/28/2023 11:18 AM   Modules accepted: Orders

## 2023-11-28 NOTE — Assessment & Plan Note (Signed)
BP is fairly well controlled.  Continue current antihypertensive medications.

## 2023-11-28 NOTE — Assessment & Plan Note (Signed)
Continue atorvastatin.  Lab Results  Component Value Date   LDLCALC 63 03/09/2023

## 2023-11-29 ENCOUNTER — Telehealth: Payer: Self-pay

## 2023-11-29 NOTE — Telephone Encounter (Signed)
Copied from CRM [id number]. Topic: Clinical - Prescription Issue >> Nov 28, 2023 11:15 AM Nila Nephew wrote: Reason for CRM: Patient was seen in-office 01/27. Patient was prescribed Mounjaro but pharmacy cannot fill it, as insurance is not accepting it. Patient is requesting an alternative be prescribed.  PA may be required (?)

## 2023-12-01 NOTE — Telephone Encounter (Addendum)
Initiated Prior authorization ZOX:[id number] 2.5MG /0.5ML auto-injectors Via: Covermymeds Case/Key:[id number] Status: approved as of 12/01/23 Reason:Authorization Expiration Date: 11/29/2026  Notified Pt via: Mychart

## 2023-12-17 NOTE — Progress Notes (Signed)
 PA Initiated.

## 2023-12-24 ENCOUNTER — Other Ambulatory Visit: Payer: Self-pay | Admitting: Family Medicine

## 2024-01-23 ENCOUNTER — Institutional Professional Consult (permissible substitution) (HOSPITAL_BASED_OUTPATIENT_CLINIC_OR_DEPARTMENT_OTHER): Payer: 59 | Admitting: Adult Health

## 2024-01-25 LAB — HM DIABETES EYE EXAM

## 2024-02-27 ENCOUNTER — Encounter: Payer: Self-pay | Admitting: Family Medicine

## 2024-02-27 ENCOUNTER — Ambulatory Visit (INDEPENDENT_AMBULATORY_CARE_PROVIDER_SITE_OTHER): Payer: 59 | Admitting: Family Medicine

## 2024-02-27 VITALS — BP 103/72 | HR 89 | Ht 71.0 in | Wt 196.0 lb

## 2024-02-27 DIAGNOSIS — E1140 Type 2 diabetes mellitus with diabetic neuropathy, unspecified: Secondary | ICD-10-CM | POA: Diagnosis not present

## 2024-02-27 DIAGNOSIS — Z1211 Encounter for screening for malignant neoplasm of colon: Secondary | ICD-10-CM

## 2024-02-27 DIAGNOSIS — I10 Essential (primary) hypertension: Secondary | ICD-10-CM | POA: Diagnosis not present

## 2024-02-27 DIAGNOSIS — I480 Paroxysmal atrial fibrillation: Secondary | ICD-10-CM

## 2024-02-27 DIAGNOSIS — E1169 Type 2 diabetes mellitus with other specified complication: Secondary | ICD-10-CM

## 2024-02-27 DIAGNOSIS — Z7984 Long term (current) use of oral hypoglycemic drugs: Secondary | ICD-10-CM

## 2024-02-27 DIAGNOSIS — E785 Hyperlipidemia, unspecified: Secondary | ICD-10-CM

## 2024-02-27 LAB — POCT GLYCOSYLATED HEMOGLOBIN (HGB A1C): HbA1c, POC (controlled diabetic range): 6.8 % (ref 0.0–7.0)

## 2024-02-27 MED ORDER — DEXCOM G7 SENSOR MISC: 11 refills | Status: DC

## 2024-02-27 MED ORDER — DEXCOM G7 RECEIVER DEVI: 0 refills | Status: DC

## 2024-02-27 NOTE — Assessment & Plan Note (Signed)
 Continue atorvastatin.  Lab Results  Component Value Date   LDLCALC 63 03/09/2023
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
## 2024-02-27 NOTE — Assessment & Plan Note (Signed)
 BP is fairly well controlled.  Continue current antihypertensive medications.

## 2024-02-27 NOTE — Assessment & Plan Note (Signed)
 Diabetes is much better controlled.  Recommend continuation of current medications. Encouraged continued dietary changes.  Follow up in 4 months

## 2024-02-27 NOTE — Assessment & Plan Note (Signed)
 This has remained well controlled.  Continue carvedilol.  Continue xarelto for anticoagulation.

## 2024-02-28 LAB — CBC WITH DIFFERENTIAL/PLATELET
Basophils Absolute: 0 10*3/uL (ref 0.0–0.2)
Basos: 1 %
EOS (ABSOLUTE): 0.1 10*3/uL (ref 0.0–0.4)
Eos: 2 %
Hematocrit: 46.4 % (ref 37.5–51.0)
Hemoglobin: 15.9 g/dL (ref 13.0–17.7)
Immature Grans (Abs): 0 10*3/uL (ref 0.0–0.1)
Immature Granulocytes: 0 %
Lymphocytes Absolute: 2.3 10*3/uL (ref 0.7–3.1)
Lymphs: 40 %
MCH: 28.5 pg (ref 26.6–33.0)
MCHC: 34.3 g/dL (ref 31.5–35.7)
MCV: 83 fL (ref 79–97)
Monocytes Absolute: 0.5 10*3/uL (ref 0.1–0.9)
Monocytes: 9 %
Neutrophils Absolute: 2.8 10*3/uL (ref 1.4–7.0)
Neutrophils: 48 %
Platelets: 311 10*3/uL (ref 150–450)
RBC: 5.57 x10E6/uL (ref 4.14–5.80)
RDW: 12.7 % (ref 11.6–15.4)
WBC: 5.7 10*3/uL (ref 3.4–10.8)

## 2024-02-28 LAB — LIPID PANEL
Chol/HDL Ratio: 3.5 ratio (ref 0.0–5.0)
Cholesterol, Total: 108 mg/dL (ref 100–199)
HDL: 31 mg/dL — ABNORMAL LOW (ref >39)
LDL Chol Calc (NIH): 61 mg/dL (ref 0–99)
Triglycerides: 78 mg/dL (ref 0–149)
VLDL Cholesterol Cal: 16 mg/dL (ref 5–40)

## 2024-02-28 LAB — CMP14+EGFR
ALT: 19 IU/L (ref 0–44)
AST: 19 IU/L (ref 0–40)
Albumin: 4.4 g/dL (ref 3.8–4.9)
Alkaline Phosphatase: 83 IU/L (ref 44–121)
BUN/Creatinine Ratio: 17 (ref 9–20)
BUN: 15 mg/dL (ref 6–24)
Bilirubin Total: 0.7 mg/dL (ref 0.0–1.2)
CO2: 24 mmol/L (ref 20–29)
Calcium: 10.4 mg/dL — ABNORMAL HIGH (ref 8.7–10.2)
Chloride: 99 mmol/L (ref 96–106)
Creatinine, Ser: 0.89 mg/dL (ref 0.76–1.27)
Globulin, Total: 2.9 g/dL (ref 1.5–4.5)
Glucose: 121 mg/dL — ABNORMAL HIGH (ref 70–99)
Potassium: 3.5 mmol/L (ref 3.5–5.2)
Sodium: 141 mmol/L (ref 134–144)
Total Protein: 7.3 g/dL (ref 6.0–8.5)
eGFR: 100 mL/min/{1.73_m2} (ref >59)

## 2024-03-07 ENCOUNTER — Encounter: Payer: Self-pay | Admitting: Family Medicine

## 2024-03-09 ENCOUNTER — Encounter (HOSPITAL_BASED_OUTPATIENT_CLINIC_OR_DEPARTMENT_OTHER): Payer: Self-pay | Admitting: Adult Health

## 2024-03-09 ENCOUNTER — Ambulatory Visit (HOSPITAL_BASED_OUTPATIENT_CLINIC_OR_DEPARTMENT_OTHER): Admitting: Adult Health

## 2024-03-09 VITALS — BP 120/79 | HR 71 | Ht 71.0 in | Wt 193.9 lb

## 2024-03-09 DIAGNOSIS — E1140 Type 2 diabetes mellitus with diabetic neuropathy, unspecified: Secondary | ICD-10-CM

## 2024-03-09 DIAGNOSIS — R0683 Snoring: Secondary | ICD-10-CM | POA: Diagnosis not present

## 2024-03-09 DIAGNOSIS — I480 Paroxysmal atrial fibrillation: Secondary | ICD-10-CM | POA: Diagnosis not present

## 2024-03-09 DIAGNOSIS — R29818 Other symptoms and signs involving the nervous system: Secondary | ICD-10-CM | POA: Diagnosis not present

## 2024-03-09 DIAGNOSIS — I10 Essential (primary) hypertension: Secondary | ICD-10-CM

## 2024-03-09 NOTE — Assessment & Plan Note (Signed)
 Snoring, restless sleep, daytime sleepiness, history of A-fib-suspicious for underlying sleep apnea.  Will set patient up for home sleep study.  Patient education given on sleep apnea.  - discussed how weight can impact sleep and risk for sleep disordered breathing - discussed options to assist with weight loss: combination of diet modification, cardiovascular and strength training exercises   - had an extensive discussion regarding the adverse health consequences related to untreated sleep disordered breathing - specifically discussed the risks for hypertension, coronary artery disease, cardiac dysrhythmias, cerebrovascular disease, and diabetes - lifestyle modification discussed   - discussed how sleep disruption can increase risk of accidents, particularly when driving - safe driving practices were discussed   Plan  Patient Instructions  Set up for home sleep study  Work on healthy weight loss Do not drive if sleepy  Follow up in 6 weeks to discuss results and treatment plan

## 2024-03-09 NOTE — Assessment & Plan Note (Signed)
 Continue follow-up primary care

## 2024-03-09 NOTE — Progress Notes (Signed)
 Epworth Sleepiness Scale  Use the following scale to choose the most appropriate number for each situation. 0 Would never nod off 1  Slight  chance of nodding off 2 Moderate chance of nodding off 3 High chance of nodding off  Sitting and reading: 3 Watching TV: 3 Sitting, inactive, in a public place (e.g., in a meeting, theater, or dinner event): 3 As a passenger in a car for an hour or more without stopping for a break: 2 Lying down to rest when circumstances permit:2 Sitting and talking to someone: 1 Sitting quietly after a meal without alcohol: 3 In a car, while stopped for a few  minutes in traffic or at a light: 0  TOTOAL: 17

## 2024-03-09 NOTE — Assessment & Plan Note (Addendum)
 Continue follow-up with primary care Continue on current regimen.  Continue with healthy weight loss.

## 2024-03-09 NOTE — Assessment & Plan Note (Addendum)
 Continue follow-up with cardiology and primary care Check home sleep study for underlying sleep apnea.

## 2024-03-09 NOTE — Patient Instructions (Signed)
Set up for home sleep study  ?Work on healthy weight loss .  ?Do not drive if sleepy  ?Follow up in 6 weeks to discuss results and treatment plan .  ?
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
## 2024-03-12 ENCOUNTER — Other Ambulatory Visit: Payer: Self-pay | Admitting: Family Medicine

## 2024-03-12 DIAGNOSIS — E1169 Type 2 diabetes mellitus with other specified complication: Secondary | ICD-10-CM

## 2024-03-19 ENCOUNTER — Encounter: Payer: Self-pay | Admitting: Family Medicine

## 2024-03-20 ENCOUNTER — Encounter: Payer: Self-pay | Admitting: Family Medicine

## 2024-03-20 ENCOUNTER — Ambulatory Visit (INDEPENDENT_AMBULATORY_CARE_PROVIDER_SITE_OTHER): Admitting: Family Medicine

## 2024-03-20 ENCOUNTER — Ambulatory Visit: Payer: Self-pay | Admitting: Family Medicine

## 2024-03-20 VITALS — BP 97/66 | HR 91 | Ht 71.0 in | Wt 195.0 lb

## 2024-03-20 DIAGNOSIS — I480 Paroxysmal atrial fibrillation: Secondary | ICD-10-CM | POA: Diagnosis not present

## 2024-03-20 LAB — BASIC METABOLIC PANEL WITH GFR
BUN/Creatinine Ratio: 10 (ref 9–20)
BUN: 8 mg/dL (ref 6–24)
CO2: 26 mmol/L (ref 20–29)
Calcium: 9.7 mg/dL (ref 8.7–10.2)
Chloride: 103 mmol/L (ref 96–106)
Creatinine, Ser: 0.79 mg/dL (ref 0.76–1.27)
Glucose: 114 mg/dL — ABNORMAL HIGH (ref 70–99)
Potassium: 3.9 mmol/L (ref 3.5–5.2)
Sodium: 139 mmol/L (ref 134–144)
eGFR: 104 mL/min/{1.73_m2} (ref >59)

## 2024-03-20 LAB — MAGNESIUM: Magnesium: 1.8 mg/dL (ref 1.6–2.3)

## 2024-03-20 LAB — TROPONIN T: Troponin T (Highly Sensitive): 8 ng/L (ref 0–22)

## 2024-03-20 MED ORDER — METOPROLOL TARTRATE 50 MG PO TABS: 50.0000 mg | ORAL_TABLET | Freq: Two times a day (BID) | ORAL | 3 refills | Status: DC

## 2024-03-20 NOTE — Assessment & Plan Note (Signed)
 EKG completed today showing atrial fibrillation with RVR.  Relatively unchanged from previous tracings when he was in atrial fibrillation.  Checking electrolytes.  Changing carvedilol  to metoprolol  continue anticoagulation with Xarelto .  Referral placed to cardiology.  Red flags reviewed.  He did have episode of chest pain a few days ago as well and I am sending stat cardiac enzymes today.

## 2024-03-20 NOTE — Patient Instructions (Addendum)
 Stop carvedilol  Start metoprolol .  We'll be in touch with lab results  Chest pain, nausea, dizziness, increased shortness of breath-SEEK EMERGENCY CARE!!
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
## 2024-03-27 ENCOUNTER — Ambulatory Visit (INDEPENDENT_AMBULATORY_CARE_PROVIDER_SITE_OTHER): Admitting: Family Medicine

## 2024-03-27 ENCOUNTER — Encounter: Payer: Self-pay | Admitting: Family Medicine

## 2024-03-27 VITALS — BP 118/78 | HR 94 | Ht 71.0 in | Wt 190.0 lb

## 2024-03-27 DIAGNOSIS — I480 Paroxysmal atrial fibrillation: Secondary | ICD-10-CM

## 2024-03-27 MED ORDER — DEXCOM G7 RECEIVER DEVI: 0 refills | Status: DC

## 2024-03-27 MED ORDER — DEXCOM G7 SENSOR MISC: 11 refills | Status: DC

## 2024-03-27 NOTE — Patient Instructions (Signed)
Atrial Fibrillation Atrial fibrillation (AFib) is a type of heartbeat that is irregular or fast. If you have AFib, your heart beats without any order. This makes it hard for your heart to pump blood in a normal way. AFib may come and go, or it may become a long-lasting problem. If AFib is not treated, it can put you at higher risk for stroke, heart failure, and other heart problems. What are the causes? AFib may be caused by diseases that damage the heart's electrical system. They include: High blood pressure. Heart failure. Heart valve diseases. Heart surgery. Diabetes. Thyroid disease. Kidney disease. Lung diseases, such as pneumonia or COPD. Sleep apnea. Sometimes the cause is not known. What increases the risk? You are more likely to develop AFib if: You are older. You exercise often and very hard. You have a family history of AFib. You are male. You are Caucasian. You are overweight. You smoke. You drink a lot of alcohol. What are the signs or symptoms? Common symptoms of this condition include: A feeling that your heart is beating very fast. Chest pain or discomfort. Feeling short of breath. Suddenly feeling light-headed or weak. Getting tired easily during activity. Fainting. Sweating. In some cases, there are no symptoms. How is this treated? Medicines to: Prevent blood clots. Treat heart rate or heart rhythm problems. Using devices, such as a pacemaker, to correct heart rhythm problems. Doing surgery to remove the part of the heart that sends bad signals. Closing an area where clots can form in the heart (left atrial appendage). In some cases, your doctor will treat other underlying conditions. Follow these instructions at home: Medicines Take over-the-counter and prescription medicines only as told by your doctor. Do not take any new medicines without first talking to your doctor. If you are taking blood thinners: Talk with your doctor before taking aspirin  or NSAIDs, such as ibuprofen. Take your medicines as told. Take them at the same time each day. Do not do things that could hurt or bruise you. Be careful to avoid falls. Wear an alert bracelet or carry a card that says you take blood thinners. Lifestyle Do not smoke or use any products that contain nicotine or tobacco. If you need help quitting, ask your doctor. Eat heart-healthy foods. Talk with your doctor about the right eating plan for you. Exercise regularly as told by your doctor. Do not drink alcohol. Lose weight if you are overweight. General instructions If you have sleep apnea, treat it as told by your doctor. Do not use diet pills unless your doctor says they are safe for you. Diet pills may make heart problems worse. Keep all follow-up visits. Your doctor will check your heart rate and rhythm regularly. Contact a doctor if: You notice a change in the speed, rhythm, or strength of your heartbeat. You are taking a blood-thinning medicine and you get more bruising. You get tired more easily when you move or exercise. You have a sudden change in weight. Get help right away if:  You have pain in your chest. You have trouble breathing. You have side effects of blood thinners, such as blood in your vomit, poop (stool), or pee (urine), or bleeding that cannot stop. You have any signs of a stroke. "BE FAST" is an easy way to remember the main warning signs: B - Balance. Dizziness, sudden trouble walking, or loss of balance. E - Eyes. Trouble seeing or a change in how you see. F - Face. Sudden weakness or loss of  feeling in the face. The face or eyelid may droop on one side. A - Arms.Weakness or loss of feeling in an arm. This happens suddenly and usually on one side of the body. S - Speech. Sudden trouble speaking, slurred speech, or trouble understanding what people say. T - Time.Time to call emergency services. Write down what time symptoms started. You have other signs of a  stroke, such as: A sudden, very bad headache with no known cause. Feeling like you may vomit (nausea). Vomiting. A seizure. These symptoms may be an emergency. Get help right away. Call 911. Do not wait to see if the symptoms will go away. Do not drive yourself to the hospital. This information is not intended to replace advice given to you by your health care provider. Make sure you discuss any questions you have with your health care provider. Document Revised: 07/07/2022 Document Reviewed: 07/07/2022 Elsevier Patient Education  2024 [employer].

## 2024-03-27 NOTE — Assessment & Plan Note (Signed)
 Once again I urged him to be seen in the ED for consideration of cardioversion as he continues to have fatigue related to atrial fibrillation.  He has not had any further episodes of chest pain.  He states that he will go to the ED tonight or tomorrow.  He refuses any transportation to the ED via EMS.  He is not driving himself and his brother accompanies him today.
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
## 2024-03-28 ENCOUNTER — Inpatient Hospital Stay (HOSPITAL_BASED_OUTPATIENT_CLINIC_OR_DEPARTMENT_OTHER)

## 2024-03-28 ENCOUNTER — Emergency Department (HOSPITAL_COMMUNITY)

## 2024-03-28 ENCOUNTER — Telehealth: Payer: Self-pay | Admitting: Family Medicine

## 2024-03-28 ENCOUNTER — Other Ambulatory Visit: Payer: Self-pay

## 2024-03-28 ENCOUNTER — Encounter

## 2024-03-28 ENCOUNTER — Encounter: Payer: Self-pay | Admitting: Cardiology

## 2024-03-28 ENCOUNTER — Emergency Department (HOSPITAL_COMMUNITY)
Admission: EM | Admit: 2024-03-28 | Discharge: 2024-03-28 | Disposition: A | Discharge status: Home / Self Care | Attending: Emergency Medicine | Admitting: Emergency Medicine

## 2024-03-28 DIAGNOSIS — E1140 Type 2 diabetes mellitus with diabetic neuropathy, unspecified: Secondary | ICD-10-CM | POA: Diagnosis present

## 2024-03-28 DIAGNOSIS — J45909 Unspecified asthma, uncomplicated: Secondary | ICD-10-CM | POA: Diagnosis not present

## 2024-03-28 DIAGNOSIS — E119 Type 2 diabetes mellitus without complications: Secondary | ICD-10-CM | POA: Diagnosis not present

## 2024-03-28 DIAGNOSIS — I4891 Unspecified atrial fibrillation: Principal | ICD-10-CM | POA: Diagnosis present

## 2024-03-28 DIAGNOSIS — I480 Paroxysmal atrial fibrillation: Secondary | ICD-10-CM | POA: Diagnosis present

## 2024-03-28 DIAGNOSIS — I10 Essential (primary) hypertension: Secondary | ICD-10-CM | POA: Diagnosis present

## 2024-03-28 DIAGNOSIS — R079 Chest pain, unspecified: Secondary | ICD-10-CM | POA: Diagnosis present

## 2024-03-28 DIAGNOSIS — R0683 Snoring: Secondary | ICD-10-CM

## 2024-03-28 DIAGNOSIS — R5383 Other fatigue: Secondary | ICD-10-CM | POA: Insufficient documentation

## 2024-03-28 LAB — TSH: TSH: <0.01 u[IU]/mL — ABNORMAL LOW (ref 0.350–4.500)

## 2024-03-28 LAB — CBC
HCT: 46.6 % (ref 39.0–52.0)
Hemoglobin: 16 g/dL (ref 13.0–17.0)
MCH: 28.4 pg (ref 26.0–34.0)
MCHC: 34.3 g/dL (ref 30.0–36.0)
MCV: 82.8 fL (ref 80.0–100.0)
Platelets: 297 10*3/uL (ref 150–400)
RBC: 5.63 MIL/uL (ref 4.22–5.81)
RDW: 12.6 % (ref 11.5–15.5)
WBC: 6.3 10*3/uL (ref 4.0–10.5)
nRBC: 0 % (ref 0.0–0.2)

## 2024-03-28 LAB — TROPONIN I (HIGH SENSITIVITY)
Troponin I (High Sensitivity): 4 ng/L (ref <18)
Troponin I (High Sensitivity): 3 ng/L (ref <18)

## 2024-03-28 LAB — BASIC METABOLIC PANEL WITH GFR
Anion gap: 12 (ref 5–15)
BUN: 13 mg/dL (ref 6–20)
CO2: 26 mmol/L (ref 22–32)
Calcium: 9.7 mg/dL (ref 8.9–10.3)
Chloride: 99 mmol/L (ref 98–111)
Creatinine, Ser: 0.87 mg/dL (ref 0.61–1.24)
GFR, Estimated: >60 mL/min (ref >60)
Glucose, Bld: 129 mg/dL — ABNORMAL HIGH (ref 70–99)
Potassium: 3.4 mmol/L — ABNORMAL LOW (ref 3.5–5.1)
Sodium: 137 mmol/L (ref 135–145)

## 2024-03-28 LAB — MAGNESIUM: Magnesium: 1.8 mg/dL (ref 1.7–2.4)

## 2024-03-28 LAB — HEMOGLOBIN A1C
Hgb A1c MFr Bld: 6.2 % — ABNORMAL HIGH (ref 4.8–5.6)
Mean Plasma Glucose: 131.24 mg/dL

## 2024-03-28 LAB — ECHOCARDIOGRAM COMPLETE BUBBLE STUDY: S' Lateral: 2.5 cm

## 2024-03-28 MED ORDER — RIVAROXABAN 10 MG PO TABS: 20.0000 mg | ORAL_TABLET | Freq: Every day | ORAL | Status: DC

## 2024-03-28 MED ORDER — PANTOPRAZOLE SODIUM 40 MG PO TBEC: 40.0000 mg | DELAYED_RELEASE_TABLET | Freq: Every day | ORAL | Status: DC

## 2024-03-28 MED ORDER — ATORVASTATIN CALCIUM 40 MG PO TABS: 80.0000 mg | ORAL_TABLET | Freq: Every day | ORAL | Status: DC

## 2024-03-28 MED ORDER — ACETAMINOPHEN 325 MG PO TABS: 650.0000 mg | ORAL_TABLET | ORAL | Status: DC | PRN

## 2024-03-28 MED ORDER — INSULIN ASPART 100 UNIT/ML IJ SOLN: 0.0000 [IU] | Freq: Three times a day (TID) | INTRAMUSCULAR | Status: DC

## 2024-03-28 MED ORDER — MAGNESIUM SULFATE 2 GM/50ML IV SOLN: 2.0000 g | Freq: Once | INTRAVENOUS | Status: DC

## 2024-03-28 MED ORDER — DILTIAZEM LOAD VIA INFUSION
15.0000 mg | Freq: Once | INTRAVENOUS | Status: AC
  Administered 2024-03-28: 15 mg via INTRAVENOUS
  Filled 2024-03-28: qty 15

## 2024-03-28 MED ORDER — ETOMIDATE 2 MG/ML IV SOLN
10.0000 mg | Freq: Once | INTRAVENOUS | Status: AC
  Administered 2024-03-28: 10 mg via INTRAVENOUS
  Filled 2024-03-28: qty 10

## 2024-03-28 MED ORDER — METOPROLOL TARTRATE 100 MG PO TABS: 100.0000 mg | ORAL_TABLET | Freq: Two times a day (BID) | ORAL | 0 refills | Status: DC

## 2024-03-28 MED ORDER — INSULIN ASPART 100 UNIT/ML IJ SOLN: 0.0000 [IU] | Freq: Every day | INTRAMUSCULAR | Status: DC

## 2024-03-28 MED ORDER — POTASSIUM CHLORIDE CRYS ER 20 MEQ PO TBCR
60.0000 meq | EXTENDED_RELEASE_TABLET | Freq: Once | ORAL | Status: AC
  Administered 2024-03-28: 60 meq via ORAL
  Filled 2024-03-28: qty 3

## 2024-03-28 MED ORDER — ONDANSETRON HCL 4 MG/2ML IJ SOLN: 4.0000 mg | Freq: Four times a day (QID) | INTRAMUSCULAR | Status: DC | PRN

## 2024-03-28 MED ORDER — METOPROLOL TARTRATE 25 MG PO TABS
50.0000 mg | ORAL_TABLET | Freq: Two times a day (BID) | ORAL | Status: DC
  Administered 2024-03-28: 50 mg via ORAL
  Filled 2024-03-28: qty 2

## 2024-03-28 MED ORDER — DILTIAZEM HCL-DEXTROSE 125-5 MG/125ML-% IV SOLN (PREMIX)
5.0000 mg/h | INTRAVENOUS | Status: DC
  Administered 2024-03-28: 5 mg/h via INTRAVENOUS
  Filled 2024-03-28: qty 125

## 2024-03-28 MED ORDER — SODIUM CHLORIDE 0.9 % IV BOLUS
500.0000 mL | Freq: Once | INTRAVENOUS | Status: AC
  Administered 2024-03-28: 500 mL via INTRAVENOUS

## 2024-03-28 NOTE — Sedation Documentation (Signed)
 120 jules, sync,

## 2024-03-28 NOTE — ED Notes (Signed)
 Echo at bedside

## 2024-03-28 NOTE — Sedation Documentation (Signed)
 Patients remained in Afib

## 2024-03-28 NOTE — ED Notes (Signed)
 Called CCMD.

## 2024-03-28 NOTE — Sedation Documentation (Signed)
 Shock 100 jules.
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
## 2024-03-28 NOTE — Discharge Instructions (Signed)
 Please continue your home medications.  We are increasing the dose of your Lopressor  to 100 mg.  I have called in new prescription to the pharmacy.  I have put in a referral to the A-fib clinic to try and get you in sooner.  Return to the emergency department with any new or suddenly worsening symptoms.

## 2024-03-28 NOTE — Telephone Encounter (Signed)
 Copied from CRM [id number]. Topic: General - Other >> Mar 28, 2024  8:22 AM Roger Fisher F wrote: Reason for CRM:   Patient is requesting work excuse letter be written to excuse him from work for the allocated days discussed during his visit with his PCP yesterday 5/27. Please have the letter drafted and contact the patient on preferred delivery method. (Patient was hoping the letter could be sent by his wife Roger Fisher) who will be in office today for a visit with the provider).   Callback Number: [phone]
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
## 2024-03-28 NOTE — ED Triage Notes (Signed)
 Pt. Stated, I went to Dr. Alana Fisher week ago and said I had A-Fib, went back yesterday and said I was in A-Fib. Told to come here yesterday and I was too scared to come.  Im feeling tired and my chest feel tight.
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
## 2024-03-29 ENCOUNTER — Ambulatory Visit: Payer: Self-pay | Admitting: Family Medicine

## 2024-03-29 LAB — COLOGUARD: COLOGUARD: NEGATIVE

## 2024-04-03 ENCOUNTER — Telehealth: Payer: Self-pay | Admitting: Family Medicine

## 2024-04-03 NOTE — Telephone Encounter (Signed)
 Patient dropped off document FMLA, to be filled out by provider. Patient requested to send it back via Fax within ASAP. Document is located in providers tray at front office.Please advise at [facility] [phone]

## 2024-04-05 ENCOUNTER — Encounter (HOSPITAL_COMMUNITY): Payer: Self-pay | Admitting: Physician Assistant

## 2024-04-05 ENCOUNTER — Ambulatory Visit (HOSPITAL_COMMUNITY)
Admission: RE | Admit: 2024-04-05 | Discharge: 2024-04-05 | Disposition: A | Discharge status: Home / Self Care | Source: Ambulatory Visit | Attending: Physician Assistant | Admitting: Physician Assistant

## 2024-04-05 VITALS — BP 126/80 | HR 118 | Ht 71.0 in | Wt 186.4 lb

## 2024-04-05 DIAGNOSIS — I480 Paroxysmal atrial fibrillation: Secondary | ICD-10-CM

## 2024-04-05 DIAGNOSIS — I4819 Other persistent atrial fibrillation: Secondary | ICD-10-CM

## 2024-04-05 DIAGNOSIS — D6869 Other thrombophilia: Secondary | ICD-10-CM

## 2024-04-05 MED ORDER — FLECAINIDE ACETATE 50 MG PO TABS: 50.0000 mg | ORAL_TABLET | Freq: Two times a day (BID) | ORAL | 3 refills | Status: DC
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
## 2024-04-05 NOTE — Patient Instructions (Signed)
 Start Flecainide 50mg  twice a day

## 2024-04-11 ENCOUNTER — Ambulatory Visit (HOSPITAL_COMMUNITY)
Admission: RE | Admit: 2024-04-11 | Discharge: 2024-04-11 | Disposition: A | Discharge status: Home / Self Care | Source: Ambulatory Visit | Attending: Physician Assistant | Admitting: Physician Assistant

## 2024-04-11 DIAGNOSIS — I4819 Other persistent atrial fibrillation: Secondary | ICD-10-CM

## 2024-04-11 DIAGNOSIS — D6869 Other thrombophilia: Secondary | ICD-10-CM

## 2024-04-11 DIAGNOSIS — I480 Paroxysmal atrial fibrillation: Secondary | ICD-10-CM

## 2024-04-11 DIAGNOSIS — Z5181 Encounter for therapeutic drug level monitoring: Secondary | ICD-10-CM

## 2024-04-11 DIAGNOSIS — G4733 Obstructive sleep apnea (adult) (pediatric): Secondary | ICD-10-CM | POA: Diagnosis not present

## 2024-04-11 MED ORDER — FLECAINIDE ACETATE 100 MG PO TABS: 100.0000 mg | ORAL_TABLET | Freq: Two times a day (BID) | ORAL | 5 refills | Status: DC

## 2024-04-11 NOTE — Patient Instructions (Signed)
 Increase flecainide  to 100mg  twice a day    Cardioversion scheduled for: June 16   - Arrive at the [facility] ([address])  and check in with ADMITTING at 8:30   - Do not eat or drink anything after midnight the night prior to your procedure.   - Take all your morning medication (except diabetic medications) with a sip of water prior to arrival.  - Do NOT miss any doses of your blood thinner - if you should miss a dose or take a dose more than 4 hours late -- please notify our office immediately.  - You will not be able to drive home after your procedure. Please ensure you have a responsible adult to drive you home. You will need someone with you for 24 hours post procedure.     - Expect to be in the procedural area approximately 2 hours.   - If you feel as if you go back into normal rhythm prior to scheduled cardioversion, please notify our office immediately.   If your procedure is canceled in the cardioversion suite you will be charged a cancellation fee.    Hold below medications 7 days prior to scheduled procedure/anesthesia.  Restart medication on the normal dosing day after scheduled procedure/anesthesia   Tirzepatide  (Mounjaro ) ([place]) HOLD JUNE 14     Hold below medications 72 hours prior to scheduled procedure/anesthesia. Restart medication on the following day after scheduled procedure/anesthesia Empagliflozin  (Jardiance ) HOLD AS JUNE 13   For those patients who have a scheduled procedure/anesthesia on the same day of the week as their dose, hold the medication on the day of surgery.  They can take their scheduled dose the week before.  **Patients on the above medications scheduled for elective procedures that have not held the medication for the appropriate amount of time are at risk of cancellation or change in the anesthetic plan.

## 2024-04-11 NOTE — Progress Notes (Addendum)
 Patient returns for ECG after starting flecainide . ECG shows:  Afib Vent. rate 103 BPM PR interval * ms QRS duration 86 ms QT/QTcB 302/395 ms  Will increase flecainide  to 100 mg BID and arrange for repeat DCCV. Follow up with Dr Madireddy as scheduled post DCCV. AF clinic in 3 months, sooner if he fails DCCV.    Informed Consent   Shared Decision Making/Informed Consent The risks (stroke, cardiac arrhythmias rarely resulting in the need for a temporary or permanent pacemaker, skin irritation or burns and complications associated with conscious sedation including aspiration, arrhythmia, respiratory failure and death), benefits (restoration of normal sinus rhythm) and alternatives of a direct current cardioversion were explained in detail to Roger Fisher and he agrees to proceed.

## 2024-04-13 NOTE — Progress Notes (Signed)
 Voice message left for patient to return call for instructions regarding appointment on 03/16/2024.

## 2024-04-16 ENCOUNTER — Ambulatory Visit (HOSPITAL_COMMUNITY): Admitting: Anesthesiology

## 2024-04-16 ENCOUNTER — Ambulatory Visit (HOSPITAL_COMMUNITY)
Admission: RE | Admit: 2024-04-16 | Discharge: 2024-04-16 | Disposition: A | Discharge status: Home / Self Care | Attending: Internal Medicine | Admitting: Internal Medicine

## 2024-04-16 ENCOUNTER — Encounter (HOSPITAL_COMMUNITY)
Admission: RE | Disposition: A | Discharge status: Home / Self Care | Payer: Self-pay | Source: Home / Self Care | Attending: Internal Medicine

## 2024-04-16 ENCOUNTER — Other Ambulatory Visit: Payer: Self-pay

## 2024-04-16 ENCOUNTER — Encounter (HOSPITAL_COMMUNITY): Payer: Self-pay | Admitting: Internal Medicine

## 2024-04-16 DIAGNOSIS — D6869 Other thrombophilia: Secondary | ICD-10-CM | POA: Insufficient documentation

## 2024-04-16 DIAGNOSIS — E119 Type 2 diabetes mellitus without complications: Secondary | ICD-10-CM

## 2024-04-16 DIAGNOSIS — Z79899 Other long term (current) drug therapy: Secondary | ICD-10-CM | POA: Insufficient documentation

## 2024-04-16 DIAGNOSIS — I10 Essential (primary) hypertension: Secondary | ICD-10-CM | POA: Insufficient documentation

## 2024-04-16 DIAGNOSIS — Z7901 Long term (current) use of anticoagulants: Secondary | ICD-10-CM | POA: Insufficient documentation

## 2024-04-16 DIAGNOSIS — Z7984 Long term (current) use of oral hypoglycemic drugs: Secondary | ICD-10-CM

## 2024-04-16 DIAGNOSIS — I4892 Unspecified atrial flutter: Secondary | ICD-10-CM

## 2024-04-16 DIAGNOSIS — I4819 Other persistent atrial fibrillation: Secondary | ICD-10-CM | POA: Diagnosis present

## 2024-04-16 HISTORY — PX: CARDIOVERSION: EP1203

## 2024-04-16 LAB — POCT I-STAT, CHEM 8
BUN: 15 mg/dL (ref 6–20)
Calcium, Ion: 1.27 mmol/L (ref 1.15–1.40)
Chloride: 99 mmol/L (ref 98–111)
Creatinine, Ser: 0.8 mg/dL (ref 0.61–1.24)
Glucose, Bld: 130 mg/dL — ABNORMAL HIGH (ref 70–99)
HCT: 39 % (ref 39.0–52.0)
Hemoglobin: 13.3 g/dL (ref 13.0–17.0)
Potassium: 2.9 mmol/L — ABNORMAL LOW (ref 3.5–5.1)
Sodium: 142 mmol/L (ref 135–145)
TCO2: 28 mmol/L (ref 22–32)

## 2024-04-16 LAB — GLUCOSE, CAPILLARY: Glucose-Capillary: 130 mg/dL — ABNORMAL HIGH (ref 70–99)

## 2024-04-16 SURGERY — CARDIOVERSION (CATH LAB)
Anesthesia: General

## 2024-04-16 MED ORDER — SODIUM CHLORIDE 0.9% FLUSH: 3.0000 mL | Freq: Two times a day (BID) | INTRAVENOUS | Status: DC

## 2024-04-16 MED ORDER — PROPOFOL 10 MG/ML IV BOLUS
INTRAVENOUS | Status: DC | PRN
  Administered 2024-04-16: 80 mg via INTRAVENOUS

## 2024-04-16 MED ORDER — POTASSIUM CHLORIDE CRYS ER 20 MEQ PO TBCR
EXTENDED_RELEASE_TABLET | ORAL | Status: AC
  Filled 2024-04-16: qty 2

## 2024-04-16 MED ORDER — POTASSIUM CHLORIDE CRYS ER 20 MEQ PO TBCR
40.0000 meq | EXTENDED_RELEASE_TABLET | Freq: Once | ORAL | Status: AC
  Administered 2024-04-16: 40 meq via ORAL

## 2024-04-16 MED ORDER — SODIUM CHLORIDE 0.9% FLUSH: 3.0000 mL | INTRAVENOUS | Status: DC | PRN

## 2024-04-16 MED ORDER — LIDOCAINE 2% (20 MG/ML) 5 ML SYRINGE
INTRAMUSCULAR | Status: DC | PRN
  Administered 2024-04-16: 40 mg via INTRAVENOUS

## 2024-04-16 SURGICAL SUPPLY — 1 items: PAD DEFIB RADIO PHYSIO CONN (PAD) ×1 IMPLANT

## 2024-04-16 NOTE — CV Procedure (Signed)
    CARDIOVERSION NOTE  Procedure: Electrical Cardioversion Indications:  Atrial Flutter  Procedure Details:  Consent: Risks of procedure as well as the alternatives and risks of each were explained to the (patient/caregiver).  Consent for procedure obtained.  Time Out: Verified patient identification, verified procedure, site/side was marked, verified correct patient position, special equipment/implants available, medications/allergies/relevent history reviewed, required imaging and test results available.  Performed  Patient placed on cardiac monitor, pulse oximetry, supplemental oxygen as necessary.  Sedation given: propofol per anesthesia Pacer pads placed anterior and posterior chest.  Cardioverted 1 time(s).  Cardioverted at 200J biphasic.  Impression: Findings: Post procedure EKG shows: NSR Complications: None Patient did tolerate procedure well.  Plan: Successful DCCV with a single 200J biphasic shock to NSR  Time Spent Directly with the Patient:  30 minutes   [name], MD, [facility], FNLA, FACP  [place]  [facility]  Medical Director of the Advanced Lipid Disorders &  Cardiovascular Risk Reduction Clinic Diplomate of the American Board of Clinical Lipidology Attending Cardiologist  Direct Dial: [phone]  Fax: [phone]  Website:  [email or web address] Rosene Pilling 04/16/2024, 9:58 AM

## 2024-04-16 NOTE — Transfer of Care (Signed)
 Immediate Anesthesia Transfer of Care Note  Patient: Roger Fisher  Procedure(s) Performed: CARDIOVERSION  Patient Location: Cath Lab  Anesthesia Type:General  Level of Consciousness: awake, alert , and oriented  Airway & Oxygen Therapy: Patient Spontanous Breathing and Patient connected to nasal cannula oxygen  Post-op Assessment: Report given to RN and Post -op Vital signs reviewed and stable  Post vital signs: Reviewed and stable  Last Vitals:  Vitals Value Taken Time  BP 93/75 0957  Temp 36 0957  Pulse 121 04/16/24 09:50  Resp 15 04/16/24 09:50  SpO2 100 % 04/16/24 09:50  Vitals shown include unfiled device data.  Last Pain:  Vitals:   04/16/24 0852  TempSrc: Temporal         Complications: No notable events documented.

## 2024-04-16 NOTE — Anesthesia Preprocedure Evaluation (Addendum)
 Anesthesia Evaluation  Patient identified by MRN, date of birth, ID band Patient awake    Reviewed: Allergy & Precautions, NPO status , Patient's Chart, lab work & pertinent test results, reviewed documented beta blocker date and time   History of Anesthesia Complications Negative for: history of anesthetic complications  Airway Mallampati: II  TM Distance: >3 FB Neck ROM: Full    Dental  (+) Caps, Dental Advisory Given   Pulmonary former smoker   breath sounds clear to auscultation       Cardiovascular hypertension, Pt. on medications and Pt. on home beta blockers (-) angina + dysrhythmias Atrial Fibrillation  Rhythm:Irregular Rate:Tachycardia  03/2024 ECHO: EF 60 to 65%.  1. The LV has normal function, no regional wall motion abnormalities.    2. RVF is normal. The right ventricular size is normal. There is normal pulmonary artery systolic pressure.   3. The mitral valve is normal in structure. No evidence of mitral valve regurgitation. No evidence of mitral stenosis.   4. The aortic valve is tricuspid. Aortic valve regurgitation is not visualized. No aortic stenosis is present.     Neuro/Psych negative neurological ROS     GI/Hepatic Neg liver ROS,GERD  Controlled and Medicated,,  Endo/Other  diabetes (glu 130), Oral Hypoglycemic Agents  Mounjaro : 10d ago  Renal/GU negative Renal ROS     Musculoskeletal   Abdominal   Peds  Hematology xarelto    Anesthesia Other Findings   Reproductive/Obstetrics                              Anesthesia Physical Anesthesia Plan  ASA: 3  Anesthesia Plan: General   Post-op Pain Management: Minimal or no pain anticipated   Induction: Intravenous  PONV Risk Score and Plan: 2 and Treatment may vary due to age or medical condition  Airway Management Planned: Nasal Cannula and Natural Airway  Additional Equipment: None  Intra-op Plan:    Post-operative Plan:   Informed Consent: I have reviewed the patients History and Physical, chart, labs and discussed the procedure including the risks, benefits and alternatives for the proposed anesthesia with the patient or authorized representative who has indicated his/her understanding and acceptance.     Dental advisory given  Plan Discussed with: CRNA and Surgeon  Anesthesia Plan Comments:         Anesthesia Quick Evaluation

## 2024-04-16 NOTE — Discharge Instructions (Addendum)
 The patient is given a work excuse note for 7 days.  Please excuse Mr. Olander from work duties due to his recent procedure. He may resume work on Monday 04/23/2024.  [name], MD, [facility], FNLA, FACP  [place]  [facility]  Medical Director of the Advanced Lipid Disorders &  Cardiovascular Risk Reduction Clinic Diplomate of the American Board of Clinical Lipidology Attending Cardiologist  Direct Dial: [phone]  Fax: [phone]  Website:  [email or web address]    Electrical Cardioversion Electrical cardioversion is the delivery of a jolt of electricity to restore a normal rhythm to the heart. A rhythm that is too fast or is not regular keeps the heart from pumping well. In this procedure, sticky patches or metal paddles are placed on the chest to deliver electricity to the heart from a device. This procedure may be done in an emergency if: There is low or no blood pressure as a result of the heart rhythm. Normal rhythm must be restored as fast as possible to protect the brain and heart from further damage. It may save a life. This may also be a scheduled procedure for irregular or fast heart rhythms that are not immediately life-threatening.  What can I expect after the procedure? Your blood pressure, heart rate, breathing rate, and blood oxygen level will be monitored until you leave the hospital or clinic. Your heart rhythm will be watched to make sure it does not change. You may have some redness on the skin where the shocks were given. Over the counter cortizone cream may be helpful.  Follow these instructions at home: Do not drive for 24 hours if you were given a sedative during your procedure. Take over-the-counter and prescription medicines only as told by your health care provider. Ask your health care provider how to check your pulse. Check it often. Rest for 48 hours after the procedure or as told by your health care provider. Avoid or limit your caffeine use as  told by your health care provider. Keep all follow-up visits as told by your health care provider. This is important. Contact a health care provider if: You feel like your heart is beating too quickly or your pulse is not regular. You have a serious muscle cramp that does not go away. Get help right away if: You have discomfort in your chest. You are dizzy or you feel faint. You have trouble breathing or you are short of breath. Your speech is slurred. You have trouble moving an arm or leg on one side of your body. Your fingers or toes turn cold or blue. Summary Electrical cardioversion is the delivery of a jolt of electricity to restore a normal rhythm to the heart. This procedure may be done right away in an emergency or may be a scheduled procedure if the condition is not an emergency. Generally, this is a safe procedure. After the procedure, check your pulse often as told by your health care provider. This information is not intended to replace advice given to you by your health care provider. Make sure you discuss any questions you have with your health care provider. Document Revised: 05/21/2019 Document Reviewed: 05/21/2019 Elsevier Patient Education  2020 [employer].

## 2024-04-16 NOTE — Anesthesia Postprocedure Evaluation (Signed)
 Anesthesia Post Note  Patient: Roger Fisher  Procedure(s) Performed: CARDIOVERSION     Patient location during evaluation: Cath Lab Anesthesia Type: General Level of consciousness: oriented, patient cooperative and awake and alert Pain management: pain level controlled Vital Signs Assessment: post-procedure vital signs reviewed and stable Respiratory status: spontaneous breathing, nonlabored ventilation and respiratory function stable Cardiovascular status: blood pressure returned to baseline and stable Postop Assessment: able to ambulate, adequate PO intake and no apparent nausea or vomiting Anesthetic complications: no  There were no known notable events for this encounter.  Last Vitals:  Vitals:   04/16/24 1008 04/16/24 1018  BP: 99/70 109/75  Pulse: 80 78  Resp: 14 18  Temp:    SpO2: 100% 99%    Last Pain:  Vitals:   04/16/24 1018  TempSrc:   PainSc: 0-No pain                 Ciarra Braddy,E. Genasis Zingale

## 2024-04-16 NOTE — Addendum Note (Signed)
 Addendum  created 04/16/24 1022 by Jonne Netters, MD   Attestation recorded in Intraprocedure, Intraprocedure Attestations deleted, Intraprocedure Attestations filed

## 2024-04-16 NOTE — Interval H&P Note (Signed)
 History and Physical Interval Note:  04/16/2024 9:25 AM  Roger Fisher  has presented today for surgery, with the diagnosis of AFIB.  The various methods of treatment have been discussed with the patient and family. After consideration of risks, benefits and other options for treatment, the patient has consented to  Procedure(s): CARDIOVERSION (N/A) as a surgical intervention.  The patient's history has been reviewed, patient examined, no change in status, stable for surgery.  I have reviewed the patient's chart and labs.  Questions were answered to the patient's satisfaction.     [name]

## 2024-04-17 NOTE — Telephone Encounter (Signed)
 Patient came into office to get him FMLA Paperwork and pay fee, patient also given copy, thanks.

## 2024-04-23 ENCOUNTER — Other Ambulatory Visit: Payer: Self-pay | Admitting: Family Medicine

## 2024-04-25 ENCOUNTER — Ambulatory Visit

## 2024-04-25 ENCOUNTER — Other Ambulatory Visit: Payer: Self-pay | Admitting: Family Medicine

## 2024-04-25 VITALS — BP 134/68 | HR 120 | Ht 71.0 in | Wt 188.0 lb

## 2024-04-25 DIAGNOSIS — I4891 Unspecified atrial fibrillation: Secondary | ICD-10-CM | POA: Diagnosis not present

## 2024-04-25 DIAGNOSIS — I10 Essential (primary) hypertension: Secondary | ICD-10-CM

## 2024-04-25 MED ORDER — RIVAROXABAN 20 MG PO TABS: ORAL_TABLET | ORAL | 2 refills | Status: AC

## 2024-04-25 MED ORDER — METOPROLOL TARTRATE 100 MG PO TABS: 100.0000 mg | ORAL_TABLET | Freq: Two times a day (BID) | ORAL | 1 refills | Status: DC
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
## 2024-04-25 NOTE — Patient Instructions (Addendum)
 Medication Instructions:  Stop Lisinopril  -hydrochlorothiazide  *If you need a refill on your cardiac medications before your next appointment, please call your pharmacy*  Lab Work: Today we are going to draw a CBC, Bmet, TSH, and Free,T4 If you have labs (blood work) drawn today and your tests are completely normal, you will receive your results only by: MyChart Message (if you have MyChart) OR A paper copy in the mail If you have any lab test that is abnormal or we need to change your treatment, we will call you to review the results.  Testing/Procedures: No testing  Follow-Up: At [facility], you and your health needs are our priority.  As part of our continuing mission to provide you with exceptional heart care, our providers are all part of one team.  This team includes your primary Cardiologist (physician) and Advanced Practice Providers or APPs (Physician Assistants and Nurse Practitioners) who all work together to provide you with the care you need, when you need it.  Your next appointment:   3 month(s)  Provider:   Alean Kobus, MD    We recommend signing up for the patient portal called MyChart.  Sign up information is provided on this After Visit Summary.  MyChart is used to connect with patients for Virtual Visits (Telemedicine).  Patients are able to view lab/test results, encounter notes, upcoming appointments, etc.  Non-urgent messages can be sent to your provider as well.   To learn more about what you can do with MyChart, go to [email or web address].   Other Instructions: We a     Dear Oneil Rummer  You are scheduled for a TEE (Transesophageal Echocardiogram) Guided Cardioversion on Wednesday, July 2nd with Dr. Oneil Parchment  Please arrive at the [facility] (Main Entrance A) at [facility]: [address] [place], [place] [address] at 10:30 AM (This time is 1 hour(s) before your procedure to ensure your preparation).   Free valet parking  service is available. You will check in at ADMITTING.   *Please Note: You will receive a call the day before your procedure to confirm the appointment time. That time may have changed from the original time based on the schedule for that day.*  DIET:  Nothing to eat or drink after midnight except a sip of water with medications (see medication instructions below)  MEDICATION INSTRUCTIONS: !!IF ANY NEW MEDICATIONS ARE STARTED AFTER TODAY, PLEASE NOTIFY YOUR PROVIDER AS SOON AS POSSIBLE!!  FYI: Medications such as Semaglutide  (Ozempic , Wegovy ), Tirzepatide  (Mounjaro , Zepbound ), Dulaglutide (Trulicity), etc (GLP1 agonists) AND Canagliflozin (Invokana), Dapagliflozin (Farxiga), Empagliflozin  (Jardiance ), Ertugliflozin (Steglatro), Bexagliflozin Arboriculturist) or any combination with one of these drugs such as Invokamet (Canagliflozin/Metformin ), Synjardy (Empagliflozin /Metformin ), etc (SGLT2 inhibitors) must be held around the time of a procedure. This is not a comprehensive list of all of these drugs. Please review all of your medications and talk to your provider if you take any one of these. If you are not sure, ask your provider.  HOLD: Tirzepatide  (Mounjaro , Zepbound ) for 7 days prior to the procedure. Last dose on Tuesday, June 24.       :1}HOLD: Empagliflozin  (Jardiance ) & Metformin  for Morning of the procedure. Last dose on Tuesday, July 01.  Continue taking your anticoagulant (blood thinner): Rivaroxaban  (Xarelto ).  You will need to continue this after your procedure until you are told by your provider that it is safe to stop.    LABS: drawn today  FYI:  For your safety, and to allow us  to monitor your  vital signs accurately during the surgery/procedure we request: If you have artificial nails, gel coating, SNS etc, please have those removed prior to your surgery/procedure. Not having the nail coverings /polish removed may result in cancellation or delay of your surgery/procedure.  Your  support person will be asked to wait in the waiting room during your procedure.  It is OK to have someone drop you off and come back when you are ready to be discharged.  You cannot drive after the procedure and will need someone to drive you home.  Bring your insurance cards.  *Special Note: Every effort is made to have your procedure done on time. Occasionally there are emergencies that occur at the hospital that may cause delays. Please be patient if a delay does occur.

## 2024-04-25 NOTE — Assessment & Plan Note (Signed)
 Well-controlled. However with A-fib RVR elevated heart rates and hypokalemia concerns discontinue lisinopril -hydrochlorothiazide  combination medication.  Monitor blood pressures at home regularly.

## 2024-04-25 NOTE — Assessment & Plan Note (Signed)
 Relatively asymptomatic at this time despite A-fib with elevated heart rates on EKG. CHA2DS2-VASc score 2. On anticoagulation with Xarelto , ran out of medications 2 days ago has missed doses yesterday and today, pending refill from his pharmacy. Unfortunately we do not have any samples at the clinic today to offer him and hence advised him to refill the medications at the earliest today.  Will schedule him for TEE cardioversion electively given the missed doses of anticoagulation.  Continue metoprolol  titrate 100 mg twice daily Continue flecainide  100 mg twice daily.  Will schedule him to have electrophysiologist consult to assess candidacy for ablation or alternate antiarrhythmics.  There is a significant concern about hyperthyroidism given his recent thyroid  panel from May 28.  Appears he has not had follow-up with regards to this. Will recheck thyroid  panel.  Advised him to get in touch with his PCP to follow-up on the thyroid  panel promptly as this could be the reason for his ongoing persistent A-fib concerns. If he does in fact have hyper thyroidism, will not be a candidate for ablation until euthyroid status is established and he continues to have recurrent episodes of A-fib.

## 2024-04-26 LAB — BASIC METABOLIC PANEL WITH GFR
BUN/Creatinine Ratio: 19 (ref 9–20)
BUN: 15 mg/dL (ref 6–24)
CO2: 20 mmol/L (ref 20–29)
Calcium: 10.4 mg/dL — ABNORMAL HIGH (ref 8.7–10.2)
Chloride: 105 mmol/L (ref 96–106)
Creatinine, Ser: 0.77 mg/dL (ref 0.76–1.27)
Glucose: 109 mg/dL — ABNORMAL HIGH (ref 70–99)
Potassium: 3.7 mmol/L (ref 3.5–5.2)
Sodium: 145 mmol/L — ABNORMAL HIGH (ref 134–144)
eGFR: 104 mL/min/{1.73_m2} (ref >59)

## 2024-04-26 LAB — TSH+FREE T4
Free T4: 2.58 ng/dL — ABNORMAL HIGH (ref 0.82–1.77)
TSH: <0.005 u[IU]/mL — ABNORMAL LOW (ref 0.450–4.500)

## 2024-04-26 LAB — CBC
Hematocrit: 46.3 % (ref 37.5–51.0)
Hemoglobin: 15.5 g/dL (ref 13.0–17.7)
MCH: 28.7 pg (ref 26.6–33.0)
MCHC: 33.5 g/dL (ref 31.5–35.7)
MCV: 86 fL (ref 79–97)
Platelets: 279 10*3/uL (ref 150–450)
RBC: 5.41 x10E6/uL (ref 4.14–5.80)
RDW: 13.6 % (ref 11.6–15.4)
WBC: 7 10*3/uL (ref 3.4–10.8)

## 2024-04-28 ENCOUNTER — Ambulatory Visit: Payer: Self-pay

## 2024-04-28 DIAGNOSIS — E039 Hypothyroidism, unspecified: Secondary | ICD-10-CM

## 2024-04-28 DIAGNOSIS — E119 Type 2 diabetes mellitus without complications: Secondary | ICD-10-CM

## 2024-04-30 DIAGNOSIS — E039 Hypothyroidism, unspecified: Secondary | ICD-10-CM | POA: Insufficient documentation

## 2024-04-30 MED ORDER — METOPROLOL TARTRATE 100 MG PO TABS: 100.0000 mg | ORAL_TABLET | Freq: Three times a day (TID) | ORAL | 1 refills | Status: DC

## 2024-04-30 NOTE — Telephone Encounter (Signed)
Viewed in MyChart Routed to PCP  

## 2024-04-30 NOTE — Telephone Encounter (Signed)
-----   Message from Davenport R Madireddy sent at 04/28/2024  6:24 PM EDT ----- Hyperthyroidism suggested by suppressed TSH and elevated free T4.  Please arrange for this:  - Hold off on the TEE cardioversion that we were planning for July 2. - Stop the flecainide . - Increase the dose of metoprolol  to tartrate 100 mg to 3 times a day, to help slow down your heart rate. - Will be placing a referral for you to see an endocrinologist and also forward your results to your primary care provider to further discuss the management.  ----- Message ----- From: Interface, [facility] Lab Results In Sent: 04/26/2024   5:37 AM EDT To: Alean SAUNDERS Madireddy, MD

## 2024-05-01 ENCOUNTER — Telehealth: Payer: Self-pay

## 2024-05-01 NOTE — Telephone Encounter (Signed)
 Left VM and sent MyChart message TEE/Cardioversion has been cancelled for 05/02/24.

## 2024-05-02 ENCOUNTER — Ambulatory Visit (HOSPITAL_COMMUNITY): Admission: RE | Admit: 2024-05-02 | Source: Home / Self Care | Admitting: Cardiology

## 2024-05-02 ENCOUNTER — Other Ambulatory Visit: Payer: Self-pay

## 2024-05-02 ENCOUNTER — Encounter (HOSPITAL_COMMUNITY): Admission: RE | Payer: Self-pay | Source: Home / Self Care

## 2024-05-02 DIAGNOSIS — I4819 Other persistent atrial fibrillation: Secondary | ICD-10-CM

## 2024-05-02 SURGERY — TRANSESOPHAGEAL ECHOCARDIOGRAM (TEE) (CATHLAB)
Anesthesia: Monitor Anesthesia Care

## 2024-05-10 ENCOUNTER — Other Ambulatory Visit: Payer: Self-pay

## 2024-05-10 MED ORDER — LISINOPRIL-HYDROCHLOROTHIAZIDE 20-12.5 MG PO TABS: 1.0000 | ORAL_TABLET | Freq: Every day | ORAL | 3 refills | Status: AC

## 2024-05-11 ENCOUNTER — Encounter (HOSPITAL_BASED_OUTPATIENT_CLINIC_OR_DEPARTMENT_OTHER): Payer: Self-pay | Admitting: Adult Health

## 2024-05-11 ENCOUNTER — Telehealth (HOSPITAL_BASED_OUTPATIENT_CLINIC_OR_DEPARTMENT_OTHER): Admitting: Adult Health

## 2024-05-11 VITALS — Ht 71.0 in | Wt 182.0 lb

## 2024-05-11 DIAGNOSIS — G4733 Obstructive sleep apnea (adult) (pediatric): Secondary | ICD-10-CM | POA: Diagnosis not present
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
## 2024-05-23 NOTE — Progress Notes (Signed)
 Electrophysiology Office Note:    Date:  05/24/2024   ID:  Roger Fisher, DOB 1966-09-12, MRN [id number]  PCP:  Alvia Bring, DO   [facility] Providers Cardiologist:  Alean SAUNDERS Madireddy, MD     Referring MD: Liborio Alean SAUNDERS, *   History of Present Illness:    Roger Fisher is a 58 y.o. male with a medical history significant for persistent atrial fibrillation, diabetes, hypertension, hyperlipidemia referred for management of atrial fibrillation.     He has persistent atrial fibrillation originally diagnosed in 2019 at which time his rhythm was paroxysmal.  He has been managed with flecainide  100 mg twice daily but recently had recurrence of atrial fibrillation which now appears to be persistent.  Discussed the use of AI scribe software for clinical note transcription with the patient, who gave verbal consent to proceed.  History of Present Illness Roger Fisher is a 58 year old male with atrial fibrillation who presents for management of persistent atrial fibrillation. He was referred for management of atrial fibrillation.  Initially diagnosed with atrial fibrillation in 2019, his condition was paroxysmal and managed with flecainide  100 mg twice daily. Recently, he has experienced a recurrence of atrial fibrillation. He feels 'real tired' and occasionally experiences chest tightness. His heart rate is erratic, sometimes fast and sometimes slow, contributing to his symptoms.  In August 2019, no atrial fibrillation was detected, although there were occasional atrial premature contractions (APCs). Initially, episodes of atrial fibrillation were intermittent, but now they are continuous. He was hospitalized in the ICU for three days when he first thought he was having a heart attack, leading to the initial diagnosis of atrial fibrillation.  He has undergone cardioversion twice, but it was unsuccessful in maintaining normal rhythm. Previously on flecainide  for two to three  weeks, it did not alleviate his symptoms, and he has since stopped taking it.  He is currently not working due to his condition and wants to resolve the issue to return to work.         Today, he reports that he is at baseline and has no acute complaints.  EKGs/Labs/Other Studies Reviewed Today:     Echocardiogram:  TTE Mar 28, 2024 LVEF 60 to 65%.  Normal right ventricular size.  Normal mitral valve structure.   Monitors:  7 day monitor August 2019 No atrial fibrillation detected though there were occasional APCs    EKG:   EKG Interpretation Date/Time:  Thursday May 24 2024 12:13:26 EDT Ventricular Rate:  106 PR Interval:    QRS Duration:  82 QT Interval:  336 QTC Calculation: 446 R Axis:   25  Text Interpretation: Atrial fibrillation with rapid ventricular response Nonspecific T wave abnormality When compared with ECG of 25-Apr-2024 09:31, No significant change was found Confirmed by Nancey Scotts [phone]) on 05/24/2024 12:43:27 PM     Physical Exam:    VS:  BP (!) 108/58 (BP Location: Left Arm, Patient Position: Sitting, Cuff Size: Large)   Pulse (!) 106   Ht 5' 11 (1.803 m)   Wt 180 lb (81.6 kg)   SpO2 98%   BMI 25.10 kg/m     Wt Readings from Last 3 Encounters:  05/24/24 180 lb (81.6 kg)  05/11/24 182 lb (82.6 kg)  04/25/24 188 lb (85.3 kg)     GEN: Well nourished, well developed in no acute distress CARDIAC: iRRR, no murmurs, rubs, gallops RESPIRATORY:  Normal work of breathing MUSCULOSKELETAL: no edema    ASSESSMENT & PLAN:  Persistent atrial fibrillation Highly symptomatic with fatigue, palpitations Having difficulty with working is on leave We discussed the natural course of atrial fibrillation and management strategies.  Using a shared decision making approach, we opted to schedule AF ablation. We discussed the indication, rationale, logistics, anticipated benefits, and potential risks of the ablation procedure including but not  limited to -- bleed at the groin access site, chest pain, damage to nearby organs such as the diaphragm, lungs, or esophagus, need for a drainage tube, or prolonged hospitalization. I explained that the risk for stroke, heart attack, need for open chest surgery, or even death is very low but not zero. he  expressed understanding and wishes to proceed.   Secondary hypercoagulable state CHA2DS2-VASc score is 2 Continue Xarelto  20 mg daily  Suppressed TSH levels Concerning for hyperthyroidism Would avoid amiodarone Should be evaluated as potential cause of atrial fibrillation   Signed, Eulas FORBES Furbish, MD  05/24/2024 12:52 PM    [facility]

## 2024-05-24 ENCOUNTER — Encounter: Payer: Self-pay | Admitting: Cardiovascular Disease

## 2024-05-24 ENCOUNTER — Ambulatory Visit: Attending: Cardiovascular Disease | Admitting: Cardiovascular Disease

## 2024-05-24 VITALS — BP 108/58 | HR 106 | Ht 71.0 in | Wt 180.0 lb

## 2024-05-24 DIAGNOSIS — Z01812 Encounter for preprocedural laboratory examination: Secondary | ICD-10-CM

## 2024-05-24 DIAGNOSIS — I480 Paroxysmal atrial fibrillation: Secondary | ICD-10-CM

## 2024-05-24 NOTE — Patient Instructions (Addendum)
 Medication Instructions:  Your physician recommends that you continue on your current medications as directed. Please refer to the Current Medication list given to you today.  *If you need a refill on your cardiac medications before your next appointment, please call your pharmacy*  Lab Work: Within 30 days of procedure.  You may go to any [facility] Location for your lab work:  [facility] - [address] ([facility]) - [address] [phone] [address]  [place] - [address]   [place]  - [address]   [place] - [address] - [address] [employer]  - [address] ([facility]   If you have labs (blood work) drawn today and your tests are completely normal, you will receive your results only by: [employer] (if you have MyChart)  If you have any lab test that is abnormal or we need to change your treatment, we will call you or send a MyChart message to review the results.  Testing/Procedures: Afib ablation to be scheduled  Follow-Up: At [facility], you and your health needs are our priority.  As part of our continuing mission to provide you with exceptional heart care, we have created designated Provider Care Teams.  These Care Teams include your primary Cardiologist (physician) and Advanced Practice Providers (APPs -  Physician Assistants and Nurse Practitioners) who all work together to provide you with the care you need, when you need it.   Your next appointment:   To be scheduled  The format for your next appointment:   In Person  Provider:   Advanced Practice Providers on your designated Care Team:   Charlies Arthur, PA-C Michael Andy Tillery, PA-C Brandy Ollis, NP  Note: Remote monitoring is used to monitor your Pacemaker/ ICD from home. This monitoring reduces the number of office visits required to check your  device to one time per year. It allows us  to keep an eye on the functioning of your device to ensure it is working properly.

## 2024-05-28 NOTE — Addendum Note (Signed)
 Addended by: CASIMIR ALDONA BRAVO on: 05/28/2024 05:19 PM   Modules accepted: Orders

## 2024-05-31 ENCOUNTER — Other Ambulatory Visit: Payer: Self-pay | Admitting: Family Medicine

## 2024-05-31 DIAGNOSIS — E119 Type 2 diabetes mellitus without complications: Secondary | ICD-10-CM

## 2024-06-01 ENCOUNTER — Telehealth: Payer: Self-pay

## 2024-06-01 NOTE — Telephone Encounter (Signed)
 Copied from CRM [id number]. Topic: General - Other >> Jun 01, 2024  8:49 AM Roger Fisher wrote: Reason for CRM: Patient requesting Short term Disability paperwok needed by 08/10. PCP already  knows issue . Needs call back  [phone]

## 2024-06-01 NOTE — Telephone Encounter (Signed)
 No documentation has been received recently.

## 2024-06-01 NOTE — Telephone Encounter (Signed)
 Spoke with patient. He will bring in the short term disability paperwork on Monday and leave for Dr. Alvia to complete

## 2024-06-01 NOTE — Telephone Encounter (Signed)
 Roger Fisher,  Have we already received this paperwork for completion that you know of ? I checked Dr. Alvia basket and media tab but did not see it .

## 2024-06-05 NOTE — Addendum Note (Signed)
 Addended by: CASIMIR ALDONA BRAVO on: 06/05/2024 03:45 PM   Modules accepted: Orders

## 2024-06-05 NOTE — Telephone Encounter (Signed)
 Patient came into office to drop off short term disability paperwork, forms placed in Dr. Alvia box, thanks.

## 2024-06-08 ENCOUNTER — Ambulatory Visit

## 2024-06-08 ENCOUNTER — Encounter: Payer: Self-pay | Admitting: Urgent Care

## 2024-06-08 ENCOUNTER — Ambulatory Visit (INDEPENDENT_AMBULATORY_CARE_PROVIDER_SITE_OTHER): Admitting: Urgent Care

## 2024-06-08 VITALS — BP 127/83 | HR 106 | Resp 20 | Ht 71.0 in | Wt 186.2 lb

## 2024-06-08 DIAGNOSIS — E0590 Thyrotoxicosis, unspecified without thyrotoxic crisis or storm: Secondary | ICD-10-CM

## 2024-06-08 DIAGNOSIS — I4819 Other persistent atrial fibrillation: Secondary | ICD-10-CM | POA: Diagnosis not present

## 2024-06-08 MED ORDER — METHIMAZOLE 5 MG PO TABS: 5.0000 mg | ORAL_TABLET | Freq: Three times a day (TID) | ORAL | 0 refills | Status: DC
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
## 2024-06-08 NOTE — Patient Instructions (Addendum)
 We drew labs today to further assess your thyroid . Please also go to [address] to schedule a thyroid  ultrasound. This is likely contributing to your heart rate and a.fib.  Start methimazole  5mg  three times daily. I have placed an urgent referral to endocrinology.  Please schedule follow up in 3 weeks.

## 2024-06-10 ENCOUNTER — Encounter: Payer: Self-pay | Admitting: Urgent Care

## 2024-06-10 ENCOUNTER — Ambulatory Visit: Payer: Self-pay | Admitting: Urgent Care

## 2024-06-12 ENCOUNTER — Other Ambulatory Visit

## 2024-06-25 ENCOUNTER — Telehealth: Payer: Self-pay

## 2024-06-25 LAB — CMP14+EGFR
ALT: 22 IU/L (ref 0–44)
AST: 17 IU/L (ref 0–40)
Albumin: 4 g/dL (ref 3.8–4.9)
Alkaline Phosphatase: 85 IU/L (ref 44–121)
BUN/Creatinine Ratio: 16 (ref 9–20)
BUN: 13 mg/dL (ref 6–24)
Bilirubin Total: 0.4 mg/dL (ref 0.0–1.2)
CO2: 20 mmol/L (ref 20–29)
Calcium: 10.1 mg/dL (ref 8.7–10.2)
Chloride: 105 mmol/L (ref 96–106)
Creatinine, Ser: 0.81 mg/dL (ref 0.76–1.27)
Globulin, Total: 2.9 g/dL (ref 1.5–4.5)
Glucose: 110 mg/dL — ABNORMAL HIGH (ref 70–99)
Potassium: 4 mmol/L (ref 3.5–5.2)
Sodium: 142 mmol/L (ref 134–144)
Total Protein: 6.9 g/dL (ref 6.0–8.5)
eGFR: 103 mL/min/1.73 (ref >59)

## 2024-06-25 LAB — THYROID STIMULATING IMMUNOGLOBULIN: Thyroid Stim Immunoglobulin: 1.05 IU/L — ABNORMAL HIGH (ref 0.00–0.55)

## 2024-06-25 LAB — T3: T3, Total: 264 ng/dL — ABNORMAL HIGH (ref 71–180)

## 2024-06-25 LAB — PTH, INTACT AND CALCIUM: PTH: 42 pg/mL (ref 15–65)

## 2024-06-25 LAB — TSH+FREE T4
Free T4: 2.78 ng/dL — ABNORMAL HIGH (ref 0.82–1.77)
TSH: <0.005 u[IU]/mL — ABNORMAL LOW (ref 0.450–4.500)

## 2024-06-25 LAB — THYROGLOBULIN LEVEL: Thyroglobulin (TG-RIA): 71 ng/mL — ABNORMAL HIGH

## 2024-06-25 NOTE — Telephone Encounter (Signed)
 Spoke with Oneil Rummer, states one part of short term disability paperwork  is completely missing being filled out.  He will bring back the copies of the paperwork he has a we sent our originals to the scan folder and they have not yet been placed in patient chart.

## 2024-06-25 NOTE — Telephone Encounter (Signed)
 Copied from CRM #[id number]. Topic: Clinical - Medical Advice >> Jun 22, 2024  1:51 PM Merlynn A wrote: Reason for CRM: Patient called in regarding STD paperwork that was completed by Dr. Benton Gave. Patient stated that the form 7A Medical Report Disability Section B page 1 of 3 needs to be completed and faxed back as he is not able to get paid due to this portion not being completed. Please complete section for patient and contact patient if any questions. Patient can be reached at [phone]. Patient is asking to be contacted via MyChart or phone once completed.

## 2024-06-25 NOTE — Telephone Encounter (Signed)
 This was completed 06/25/24 and given to Tiffany. Thanks

## 2024-06-25 NOTE — Telephone Encounter (Signed)
 I'm not sure if this is an old message or not?? He did come back to the office and an additional portion was completed. Can you see if this has been adequately taken care of or if there is something additional that needs to be done? thanks

## 2024-06-26 NOTE — Telephone Encounter (Signed)
 Forms faxed to the provided fax number yesterday. Annabella Rigg, CMA

## 2024-06-29 ENCOUNTER — Ambulatory Visit (INDEPENDENT_AMBULATORY_CARE_PROVIDER_SITE_OTHER): Admitting: Family Medicine

## 2024-06-29 ENCOUNTER — Encounter: Payer: Self-pay | Admitting: Family Medicine

## 2024-06-29 VITALS — BP 132/86 | HR 83 | Ht 71.0 in | Wt 189.0 lb

## 2024-06-29 DIAGNOSIS — E119 Type 2 diabetes mellitus without complications: Secondary | ICD-10-CM | POA: Diagnosis not present

## 2024-06-29 DIAGNOSIS — I10 Essential (primary) hypertension: Secondary | ICD-10-CM

## 2024-06-29 DIAGNOSIS — E1140 Type 2 diabetes mellitus with diabetic neuropathy, unspecified: Secondary | ICD-10-CM | POA: Diagnosis not present

## 2024-06-29 DIAGNOSIS — E0590 Thyrotoxicosis, unspecified without thyrotoxic crisis or storm: Secondary | ICD-10-CM

## 2024-06-29 DIAGNOSIS — Z7984 Long term (current) use of oral hypoglycemic drugs: Secondary | ICD-10-CM

## 2024-06-29 DIAGNOSIS — I480 Paroxysmal atrial fibrillation: Secondary | ICD-10-CM

## 2024-06-29 LAB — POCT GLYCOSYLATED HEMOGLOBIN (HGB A1C): HbA1c, POC (controlled diabetic range): 6 % (ref 0.0–7.0)

## 2024-06-29 MED ORDER — METOPROLOL TARTRATE 100 MG PO TABS: 100.0000 mg | ORAL_TABLET | Freq: Three times a day (TID) | ORAL | 1 refills | Status: DC

## 2024-06-29 NOTE — Assessment & Plan Note (Signed)
 Diabetes is much better controlled.  Recommend continuation of current medications. Encouraged continued dietary changes.
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
## 2024-06-29 NOTE — Assessment & Plan Note (Signed)
 Currently on methimazole .  Rechecking TFT's.  Endo appt in November.

## 2024-06-29 NOTE — Assessment & Plan Note (Addendum)
 Likely worsened  by hyperthyroidism.  Management per cardiology.  Has upcoming ablation.

## 2024-06-29 NOTE — Assessment & Plan Note (Signed)
 BP is fairly well controlled.  Continue current antihypertensive medications.

## 2024-06-30 LAB — T3: T3, Total: 158 ng/dL (ref 71–180)

## 2024-06-30 LAB — TSH+FREE T4
Free T4: 1.6 ng/dL (ref 0.82–1.77)
TSH: <0.005 u[IU]/mL — ABNORMAL LOW (ref 0.450–4.500)

## 2024-07-06 ENCOUNTER — Other Ambulatory Visit: Payer: Self-pay | Admitting: Family Medicine

## 2024-07-06 DIAGNOSIS — I10 Essential (primary) hypertension: Secondary | ICD-10-CM

## 2024-07-08 ENCOUNTER — Ambulatory Visit: Payer: Self-pay | Admitting: Family Medicine

## 2024-07-12 ENCOUNTER — Ambulatory Visit (HOSPITAL_COMMUNITY)
Admission: RE | Admit: 2024-07-12 | Discharge: 2024-07-12 | Disposition: A | Discharge status: Home / Self Care | Source: Ambulatory Visit | Attending: Physician Assistant | Admitting: Physician Assistant

## 2024-07-12 VITALS — BP 98/64 | HR 98 | Ht 71.0 in | Wt 187.2 lb

## 2024-07-12 DIAGNOSIS — I4891 Unspecified atrial fibrillation: Secondary | ICD-10-CM | POA: Diagnosis not present

## 2024-07-12 DIAGNOSIS — I4819 Other persistent atrial fibrillation: Secondary | ICD-10-CM

## 2024-07-12 DIAGNOSIS — D6869 Other thrombophilia: Secondary | ICD-10-CM | POA: Diagnosis not present
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
## 2024-07-14 LAB — BASIC METABOLIC PANEL WITH GFR
BUN/Creatinine Ratio: 15 (ref 9–20)
BUN: 15 mg/dL (ref 6–24)
CO2: 24 mmol/L (ref 20–29)
Calcium: 9.4 mg/dL (ref 8.7–10.2)
Chloride: 100 mmol/L (ref 96–106)
Creatinine, Ser: 0.99 mg/dL (ref 0.76–1.27)
Glucose: 101 mg/dL — ABNORMAL HIGH (ref 70–99)
Potassium: 4 mmol/L (ref 3.5–5.2)
Sodium: 140 mmol/L (ref 134–144)
eGFR: 89 mL/min/1.73 (ref >59)

## 2024-07-14 LAB — CBC
Hematocrit: 44 % (ref 37.5–51.0)
Hemoglobin: 14.7 g/dL (ref 13.0–17.7)
MCH: 28.2 pg (ref 26.6–33.0)
MCHC: 33.4 g/dL (ref 31.5–35.7)
MCV: 84 fL (ref 79–97)
Platelets: 289 x10E3/uL (ref 150–450)
RBC: 5.22 x10E6/uL (ref 4.14–5.80)
RDW: 13.8 % (ref 11.6–15.4)
WBC: 7.1 x10E3/uL (ref 3.4–10.8)

## 2024-07-16 ENCOUNTER — Ambulatory Visit: Payer: Self-pay

## 2024-07-20 NOTE — Pre-Procedure Instructions (Signed)
 Attempted to call patient regarding procedure instructions.  No answer unable to leave a voiemail.  Below are the followin instructions.  wing items: Arrival time 1000 Nothing to eat or drink after midnight No meds AM of procedure Responsible person to drive you home and stay with you for 24 hrs  Have you missed any doses of anti-coagulant Xarelto - should be taken once a day, if you have missed any doses please let us  know.

## 2024-07-23 ENCOUNTER — Encounter (HOSPITAL_COMMUNITY): Payer: Self-pay | Admitting: Cardiovascular Disease

## 2024-07-23 ENCOUNTER — Ambulatory Visit

## 2024-07-23 ENCOUNTER — Other Ambulatory Visit: Payer: Self-pay

## 2024-07-23 ENCOUNTER — Ambulatory Visit (HOSPITAL_COMMUNITY)

## 2024-07-23 ENCOUNTER — Ambulatory Visit (HOSPITAL_COMMUNITY)
Admission: RE | Admit: 2024-07-23 | Discharge: 2024-07-23 | Disposition: A | Discharge status: Home / Self Care | Attending: Cardiovascular Disease | Admitting: Cardiovascular Disease

## 2024-07-23 ENCOUNTER — Ambulatory Visit (HOSPITAL_BASED_OUTPATIENT_CLINIC_OR_DEPARTMENT_OTHER)

## 2024-07-23 ENCOUNTER — Ambulatory Visit (HOSPITAL_COMMUNITY)
Admission: RE | Disposition: A | Discharge status: Home / Self Care | Payer: Self-pay | Source: Home / Self Care | Attending: Cardiovascular Disease

## 2024-07-23 DIAGNOSIS — I10 Essential (primary) hypertension: Secondary | ICD-10-CM | POA: Insufficient documentation

## 2024-07-23 DIAGNOSIS — Z7984 Long term (current) use of oral hypoglycemic drugs: Secondary | ICD-10-CM | POA: Diagnosis not present

## 2024-07-23 DIAGNOSIS — E039 Hypothyroidism, unspecified: Secondary | ICD-10-CM

## 2024-07-23 DIAGNOSIS — Z87891 Personal history of nicotine dependence: Secondary | ICD-10-CM | POA: Diagnosis not present

## 2024-07-23 DIAGNOSIS — J45909 Unspecified asthma, uncomplicated: Secondary | ICD-10-CM | POA: Diagnosis not present

## 2024-07-23 DIAGNOSIS — I4819 Other persistent atrial fibrillation: Secondary | ICD-10-CM | POA: Diagnosis present

## 2024-07-23 DIAGNOSIS — I480 Paroxysmal atrial fibrillation: Secondary | ICD-10-CM

## 2024-07-23 DIAGNOSIS — E119 Type 2 diabetes mellitus without complications: Secondary | ICD-10-CM | POA: Insufficient documentation

## 2024-07-23 DIAGNOSIS — D6869 Other thrombophilia: Secondary | ICD-10-CM | POA: Insufficient documentation

## 2024-07-23 DIAGNOSIS — Z7901 Long term (current) use of anticoagulants: Secondary | ICD-10-CM | POA: Diagnosis not present

## 2024-07-23 HISTORY — PX: ATRIAL FIBRILLATION ABLATION: EP1191

## 2024-07-23 LAB — GLUCOSE, CAPILLARY
Glucose-Capillary: 115 mg/dL — ABNORMAL HIGH (ref 70–99)
Glucose-Capillary: 114 mg/dL — ABNORMAL HIGH (ref 70–99)

## 2024-07-23 LAB — POCT ACTIVATED CLOTTING TIME: Activated Clotting Time: 273 s

## 2024-07-23 SURGERY — ATRIAL FIBRILLATION ABLATION
Anesthesia: General

## 2024-07-23 MED ORDER — SUGAMMADEX SODIUM 200 MG/2ML IV SOLN
INTRAVENOUS | Status: DC | PRN
  Administered 2024-07-23: 200 mg via INTRAVENOUS

## 2024-07-23 MED ORDER — SODIUM CHLORIDE 0.9% FLUSH: 3.0000 mL | INTRAVENOUS | Status: DC | PRN

## 2024-07-23 MED ORDER — SODIUM CHLORIDE 0.9 % IV SOLN: 250.0000 mL | INTRAVENOUS | Status: DC | PRN

## 2024-07-23 MED ORDER — SODIUM CHLORIDE 0.9 % IV SOLN: INTRAVENOUS | Status: DC

## 2024-07-23 MED ORDER — PROPOFOL 10 MG/ML IV BOLUS
INTRAVENOUS | Status: DC | PRN
  Administered 2024-07-23: 150 mg via INTRAVENOUS
  Administered 2024-07-23: 30 mg via INTRAVENOUS

## 2024-07-23 MED ORDER — FENTANYL CITRATE (PF) 100 MCG/2ML IJ SOLN
INTRAMUSCULAR | Status: AC
  Filled 2024-07-23: qty 2

## 2024-07-23 MED ORDER — SODIUM CHLORIDE 0.9% FLUSH: 3.0000 mL | Freq: Two times a day (BID) | INTRAVENOUS | Status: DC

## 2024-07-23 MED ORDER — MIDAZOLAM HCL 2 MG/2ML IJ SOLN
INTRAMUSCULAR | Status: DC | PRN
  Administered 2024-07-23: 2 mg via INTRAVENOUS

## 2024-07-23 MED ORDER — ACETAMINOPHEN 325 MG PO TABS: 650.0000 mg | ORAL_TABLET | ORAL | Status: DC | PRN

## 2024-07-23 MED ORDER — ROCURONIUM BROMIDE 10 MG/ML (PF) SYRINGE
PREFILLED_SYRINGE | INTRAVENOUS | Status: DC | PRN
  Administered 2024-07-23: 50 mg via INTRAVENOUS
  Administered 2024-07-23: 10 mg via INTRAVENOUS
  Administered 2024-07-23: 30 mg via INTRAVENOUS

## 2024-07-23 MED ORDER — DEXAMETHASONE SODIUM PHOSPHATE 10 MG/ML IJ SOLN
INTRAMUSCULAR | Status: DC | PRN
  Administered 2024-07-23: 10 mg via INTRAVENOUS

## 2024-07-23 MED ORDER — FENTANYL CITRATE (PF) 250 MCG/5ML IJ SOLN
INTRAMUSCULAR | Status: DC | PRN
  Administered 2024-07-23 (×2): 50 ug via INTRAVENOUS

## 2024-07-23 MED ORDER — PROTAMINE SULFATE 10 MG/ML IV SOLN
INTRAVENOUS | Status: DC | PRN
  Administered 2024-07-23: 50 mg via INTRAVENOUS

## 2024-07-23 MED ORDER — PROPOFOL 500 MG/50ML IV EMUL
INTRAVENOUS | Status: DC | PRN
  Administered 2024-07-23: 75 ug/kg/min via INTRAVENOUS

## 2024-07-23 MED ORDER — LIDOCAINE 2% (20 MG/ML) 5 ML SYRINGE
INTRAMUSCULAR | Status: DC | PRN
  Administered 2024-07-23: 60 mg via INTRAVENOUS

## 2024-07-23 MED ORDER — ONDANSETRON HCL 4 MG/2ML IJ SOLN
INTRAMUSCULAR | Status: DC | PRN
  Administered 2024-07-23: 4 mg via INTRAVENOUS

## 2024-07-23 MED ORDER — MIDAZOLAM HCL 2 MG/2ML IJ SOLN
INTRAMUSCULAR | Status: AC
  Filled 2024-07-23: qty 2

## 2024-07-23 MED ORDER — ONDANSETRON HCL 4 MG/2ML IJ SOLN: 4.0000 mg | Freq: Four times a day (QID) | INTRAMUSCULAR | Status: DC | PRN

## 2024-07-23 MED ORDER — ATROPINE SULFATE 1 MG/10ML IJ SOSY
PREFILLED_SYRINGE | INTRAMUSCULAR | Status: DC | PRN
  Administered 2024-07-23: 1 mg via INTRAVENOUS

## 2024-07-23 MED ORDER — HEPARIN SODIUM (PORCINE) 1000 UNIT/ML IJ SOLN
INTRAMUSCULAR | Status: DC | PRN
  Administered 2024-07-23: 4000 [IU] via INTRAVENOUS
  Administered 2024-07-23: 14000 [IU] via INTRAVENOUS

## 2024-07-23 SURGICAL SUPPLY — 19 items
BAG SNAP BAND KOVER 36X36 (MISCELLANEOUS) IMPLANT
BLANKET WARM UNDERBOD FULL ACC (MISCELLANEOUS) ×1 IMPLANT
CABLE FARASTAR GEN2 SNGL USE (CABLE) IMPLANT
CATH ACUNAV GE 8F-90 (CATHETERS) IMPLANT
CATH FARAWAVE 2.0 31 (CATHETERS) IMPLANT
CATH GE 8FR SOUNDSTAR (CATHETERS) IMPLANT
CATH OCTARAY 2.0 F 3-3-3-3-3 (CATHETERS) IMPLANT
COVER SWIFTLINK CONNECTOR (BAG) ×1 IMPLANT
DEVICE CLOSURE MYNXGRIP 6/7F (Vascular Products) IMPLANT
DILATOR VESSEL 38 20CM 16FR (INTRODUCER) IMPLANT
GUIDEWIRE INQWIRE 1.5J.035X260 (WIRE) IMPLANT
KIT VERSACROSS CNCT FARADRIVE (KITS) IMPLANT
PACK EP LF (CUSTOM PROCEDURE TRAY) ×1 IMPLANT
PAD DEFIB RADIO PHYSIO CONN (PAD) ×1 IMPLANT
PATCH CARTO3 (PAD) IMPLANT
SHEATH FARADRIVE STEERABLE (SHEATH) IMPLANT
SHEATH PINNACLE 8F 10CM (SHEATH) IMPLANT
SHEATH PINNACLE 9F 10CM (SHEATH) IMPLANT
SHEATH PROBE COVER 6X72 (BAG) IMPLANT

## 2024-07-23 NOTE — Anesthesia Preprocedure Evaluation (Signed)
 Anesthesia Evaluation  Patient identified by MRN, date of birth, ID band Patient awake    Reviewed: Allergy & Precautions, NPO status , Patient's Chart, lab work & pertinent test results, reviewed documented beta blocker date and time   Airway Mallampati: II  TM Distance: >3 FB Neck ROM: Full    Dental  (+) Poor Dentition, Chipped, Dental Advisory Given, Missing   Pulmonary asthma , former smoker   Pulmonary exam normal breath sounds clear to auscultation       Cardiovascular hypertension, Pt. on home beta blockers and Pt. on medications Normal cardiovascular exam+ dysrhythmias (xarelto ) Atrial Fibrillation  Rhythm:Irregular Rate:Normal  Echo 03/28/24 demonstrated   1. Left ventricular ejection fraction, by estimation, is 60 to 65%. The  left ventricle has normal function. The left ventricle has no regional  wall motion abnormalities. Left ventricular diastolic function could not  be evaluated.   2. Right ventricular systolic function is normal. The right ventricular  size is normal. There is normal pulmonary artery systolic pressure.   3. The mitral valve is normal in structure. No evidence of mitral valve  regurgitation. No evidence of mitral stenosis.   4. The aortic valve is tricuspid. Aortic valve regurgitation is not  visualized. No aortic stenosis is present.   5. The inferior vena cava is normal in size with greater than 50%  respiratory variability, suggesting right atrial pressure of 3 mmHg.   6. Agitated saline contrast bubble study was negative, with no evidence  of any interatrial shunt.     Neuro/Psych negative neurological ROS  negative psych ROS   GI/Hepatic Neg liver ROS,GERD  ,,  Endo/Other  diabetes, Type 2, Oral Hypoglycemic AgentsHypothyroidism    Renal/GU negative Renal ROS  negative genitourinary   Musculoskeletal negative musculoskeletal ROS (+)    Abdominal   Peds  Hematology negative  hematology ROS (+)   Anesthesia Other Findings   Reproductive/Obstetrics                              Anesthesia Physical Anesthesia Plan  ASA: 3  Anesthesia Plan: General   Post-op Pain Management: Minimal or no pain anticipated   Induction: Intravenous  PONV Risk Score and Plan: Midazolam , Dexamethasone  and Ondansetron   Airway Management Planned: Oral ETT  Additional Equipment:   Intra-op Plan:   Post-operative Plan: Extubation in OR  Informed Consent: I have reviewed the patients History and Physical, chart, labs and discussed the procedure including the risks, benefits and alternatives for the proposed anesthesia with the patient or authorized representative who has indicated his/her understanding and acceptance.     Dental advisory given  Plan Discussed with: CRNA  Anesthesia Plan Comments:          Anesthesia Quick Evaluation

## 2024-07-23 NOTE — H&P (Signed)
 Electrophysiology Office Note:    Date:  07/23/2024   ID:  Roger Fisher, DOB 10/23/1966, MRN [id number]  PCP:  Alvia Bring, DO   [facility] Providers Cardiologist:  Alean SAUNDERS Madireddy, MD Electrophysiologist:  Eulas FORBES Furbish, MD     Referring MD: Furbish Eulas FORBES, MD   History of Present Illness:    Roger Fisher is a 58 y.o. male with a medical history significant for persistent atrial fibrillation, diabetes, hypertension, hyperlipidemia referred for management of atrial fibrillation.     He has persistent atrial fibrillation originally diagnosed in 2019 at which time his rhythm was paroxysmal.  He has been managed with flecainide  100 mg twice daily but recently had recurrence of atrial fibrillation which now appears to be persistent.  Discussed the use of AI scribe software for clinical note transcription with the patient, who gave verbal consent to proceed.  History of Present Illness Roger Fisher is a 58 year old male with atrial fibrillation who presents for management of persistent atrial fibrillation. He was referred for management of atrial fibrillation.  Initially diagnosed with atrial fibrillation in 2019, his condition was paroxysmal and managed with flecainide  100 mg twice daily. Recently, he has experienced a recurrence of atrial fibrillation. He feels 'real tired' and occasionally experiences chest tightness. His heart rate is erratic, sometimes fast and sometimes slow, contributing to his symptoms.  In August 2019, no atrial fibrillation was detected, although there were occasional atrial premature contractions (APCs). Initially, episodes of atrial fibrillation were intermittent, but now they are continuous. He was hospitalized in the ICU for three days when he first thought he was having a heart attack, leading to the initial diagnosis of atrial fibrillation.  He has undergone cardioversion twice, but it was unsuccessful in maintaining normal rhythm.  Previously on flecainide  for two to three weeks, it did not alleviate his symptoms, and he has since stopped taking it.  He is currently not working due to his condition and wants to resolve the issue to return to work.         Today, he reports that he is at baseline and has no acute complaints. I reviewed the patient's labs. he  has not missed any doses of anticoagulation, and he took his dose last night. There have been no changes in the patient's diagnoses, medications, or condition since our recent clinic visit.   EKGs/Labs/Other Studies Reviewed Today:     Echocardiogram:  TTE Mar 28, 2024 LVEF 60 to 65%.  Normal right ventricular size.  Normal mitral valve structure.   Monitors:  7 day monitor August 2019 No atrial fibrillation detected though there were occasional APCs    EKG:         Physical Exam:    VS:  BP 129/88   Pulse 93   Temp 98 F (36.7 C) (Oral)   Resp 18   Ht 5' 11 (1.803 m)   Wt 86.2 kg   SpO2 99%   BMI 26.50 kg/m     Wt Readings from Last 3 Encounters:  07/23/24 86.2 kg  07/12/24 84.9 kg  06/29/24 85.7 kg     GEN: Well nourished, well developed in no acute distress CARDIAC: iRRR, no murmurs, rubs, gallops RESPIRATORY:  Normal work of breathing MUSCULOSKELETAL: no edema    ASSESSMENT & PLAN:     Persistent atrial fibrillation Highly symptomatic with fatigue, palpitations Having difficulty with working is on leave We discussed the natural course of atrial fibrillation and management strategies.  Using a shared decision making approach, we opted to schedule AF ablation.  We discussed the indication, rationale, logistics, anticipated benefits, and potential risks of the ablation procedure including but not limited to -- bleed at the groin access site, chest pain, damage to nearby organs such as the diaphragm, lungs, or esophagus, need for a drainage tube, or prolonged hospitalization. I explained that the risk for stroke, heart  attack, need for open chest surgery, or even death is very low but not zero. he  expressed understanding and wishes to proceed.   Secondary hypercoagulable state CHA2DS2-VASc score is 2 Continue Xarelto  20 mg daily  Suppressed TSH levels Concerning for hyperthyroidism Would avoid amiodarone Should be evaluated as potential cause of atrial fibrillation   Signed, Eulas FORBES Furbish, MD  07/23/2024 11:06 AM    [facility]

## 2024-07-23 NOTE — Anesthesia Procedure Notes (Signed)
 Procedure Name: Intubation Date/Time: 07/23/2024 11:45 AM  Performed by: Moishe Reyes CROME, CRNAPre-anesthesia Checklist: Patient identified, Emergency Drugs available, Suction available, Patient being monitored and Timeout performed Patient Re-evaluated:Patient Re-evaluated prior to induction Oxygen Delivery Method: Circle system utilized Preoxygenation: Pre-oxygenation with 100% oxygen Induction Type: IV induction Ventilation: Mask ventilation without difficulty Laryngoscope Size: McGrath and 3 Grade View: Grade I Tube size: 7.0 mm Number of attempts: 1 Airway Equipment and Method: Stylet and Video-laryngoscopy Placement Confirmation: ETT inserted through vocal cords under direct vision, positive ETCO2, CO2 detector and breath sounds checked- equal and bilateral Secured at: 21 cm Tube secured with: Tape Dental Injury: Teeth and Oropharynx as per pre-operative assessment

## 2024-07-23 NOTE — Progress Notes (Signed)
 Patient ambulated to the bathroom and was able to void. No bleeding or hematoma noted to bilateral groin sites. Dressings clean, dry, and intact.  Discharge instructions completed. No concerns voiced.

## 2024-07-23 NOTE — Transfer of Care (Signed)
 Immediate Anesthesia Transfer of Care Note  Patient: Heath Tesler  Procedure(s) Performed: ATRIAL FIBRILLATION ABLATION  Patient Location: PACU and Cath Lab  Anesthesia Type:General  Level of Consciousness: awake  Airway & Oxygen Therapy: Patient connected to nasal cannula oxygen  Post-op Assessment: Report given to RN and Post -op Vital signs reviewed and stable  Post vital signs: stable  Last Vitals:  Vitals Value Taken Time  BP 108/65 07/23/24 13:00  Temp    Pulse 105 07/23/24 13:01  Resp 26 07/23/24 13:01  SpO2 93 % 07/23/24 13:01  Vitals shown include unfiled device data.  Last Pain:  Vitals:   07/23/24 1038  TempSrc:   PainSc: 0-No pain      Patients Stated Pain Goal: 5 (07/23/24 1038)  Complications: There were no known notable events for this encounter.

## 2024-07-23 NOTE — Discharge Instructions (Signed)

## 2024-07-24 ENCOUNTER — Encounter (HOSPITAL_COMMUNITY): Payer: Self-pay | Admitting: Cardiovascular Disease

## 2024-07-24 ENCOUNTER — Telehealth: Payer: Self-pay

## 2024-07-24 ENCOUNTER — Telehealth (HOSPITAL_COMMUNITY): Payer: Self-pay

## 2024-07-24 ENCOUNTER — Encounter: Payer: Self-pay | Admitting: Family Medicine

## 2024-07-24 NOTE — Telephone Encounter (Signed)
 Copied from CRM [id number]. Topic: General - Other >> Jul 24, 2024  4:11 PM Susanna ORN wrote: Reason for CRM: Patient called in stating that he had heart surgery yesterday. He's been waiting for a while for short term disability from his job. States previously he gave papers to be filled out by Dr. Alvia but he was out at the time, therefore, Dr. Lowella filled them out. Patient states the insurance company will not accept it because they states that Dr. Lowella is just a PA and not an MD. They are needing Dr. Alvia signature instead of Dr. Gay. Patient has been out of work for 5 weeks now. He will be sending paperwork/form via MyChart to Dr. Alvia to fill out and send back immediately as the insurance company is requesting to have them back within 1-2 days. Please look out for these form(s) and have Dr. Alvia to complete them. For further questions and concerns, please give patient a call back. CB #: [phone].

## 2024-07-24 NOTE — Anesthesia Postprocedure Evaluation (Signed)
 Anesthesia Post Note  Patient: Roger Fisher  Procedure(s) Performed: ATRIAL FIBRILLATION ABLATION     Patient location during evaluation: PACU Anesthesia Type: General Level of consciousness: awake and alert Pain management: pain level controlled Vital Signs Assessment: post-procedure vital signs reviewed and stable Respiratory status: spontaneous breathing, nonlabored ventilation, respiratory function stable and patient connected to nasal cannula oxygen Cardiovascular status: blood pressure returned to baseline and stable Postop Assessment: no apparent nausea or vomiting Anesthetic complications: no   There were no known notable events for this encounter.  Last Vitals:  Vitals:   07/23/24 1500 07/23/24 1600  BP: 122/72 128/81  Pulse: 95 97  Resp: (!) 8 13  Temp:    SpO2: 98% 99%    Last Pain:  Vitals:   07/23/24 1038  TempSrc:   PainSc: 0-No pain                 Thom JONELLE Peoples

## 2024-07-24 NOTE — Telephone Encounter (Signed)
 Spoke with patient to complete post procedure follow up call. He confirmed previous home number called is no longer valid. Patient contact information updated.   Patient reports no complications with groin sites.   Instructions reviewed with patient:  Remove large bandage at puncture site after 24 hours. It is normal to have bruising, tenderness, mild swelling, and a pea or marble sized lump/knot at the groin site which can take up to three months to resolve.  Get help right away if you notice sudden swelling at the puncture site.  Check your puncture site every day for signs of infection: fever, redness, swelling, pus drainage, warmth, foul odor or excessive pain. If this occurs, please call [phone], to speak with the RN Navigator. Get help right away if your puncture site is bleeding and the bleeding does not stop after applying firm pressure to the area.  You may continue to have skipped beats/ atrial fibrillation during the first several months after your procedure.  It is very important not to miss any doses of your blood thinner Xarelto .    You will follow up with the Afib clinic on 08/20/24 and follow up with  Dr.Augustus Mealor on 10/23/24.   Patient verbalized understanding to all instructions provided.

## 2024-07-25 MED FILL — Fentanyl Citrate Preservative Free (PF) Inj 100 MCG/2ML: INTRAMUSCULAR | Qty: 2 | Status: AC

## 2024-07-27 ENCOUNTER — Encounter: Payer: Self-pay | Admitting: Family Medicine

## 2024-07-27 ENCOUNTER — Ambulatory Visit: Admitting: Family Medicine

## 2024-07-27 VITALS — BP 138/79 | HR 82 | Ht 71.0 in | Wt 196.0 lb

## 2024-07-27 DIAGNOSIS — E1140 Type 2 diabetes mellitus with diabetic neuropathy, unspecified: Secondary | ICD-10-CM

## 2024-07-27 DIAGNOSIS — E0590 Thyrotoxicosis, unspecified without thyrotoxic crisis or storm: Secondary | ICD-10-CM

## 2024-07-27 DIAGNOSIS — I480 Paroxysmal atrial fibrillation: Secondary | ICD-10-CM | POA: Diagnosis not present

## 2024-07-27 DIAGNOSIS — E785 Hyperlipidemia, unspecified: Secondary | ICD-10-CM

## 2024-07-27 DIAGNOSIS — E1169 Type 2 diabetes mellitus with other specified complication: Secondary | ICD-10-CM

## 2024-07-27 DIAGNOSIS — I10 Essential (primary) hypertension: Secondary | ICD-10-CM | POA: Diagnosis not present

## 2024-07-27 NOTE — Telephone Encounter (Signed)
 Attempted call to patient. Left a voice mail message requesting a return call.

## 2024-07-27 NOTE — Telephone Encounter (Signed)
 Called patient, informed that Forms faxed (confirmation  successful) , and put in occordion, thanks.
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
## 2024-07-28 ENCOUNTER — Other Ambulatory Visit: Payer: Self-pay | Admitting: Family Medicine

## 2024-07-29 ENCOUNTER — Encounter: Payer: Self-pay | Admitting: Family Medicine

## 2024-07-30 ENCOUNTER — Other Ambulatory Visit: Payer: Self-pay | Admitting: Urgent Care

## 2024-07-30 DIAGNOSIS — E0590 Thyrotoxicosis, unspecified without thyrotoxic crisis or storm: Secondary | ICD-10-CM

## 2024-07-30 NOTE — Assessment & Plan Note (Signed)
 Blood sugars remain fairly well-controlled.  Recommend continuation of current medications.

## 2024-07-30 NOTE — Assessment & Plan Note (Signed)
 Continue atorvastatin .  Lab Results  Component Value Date   LDLCALC 61 02/27/2024

## 2024-07-30 NOTE — Assessment & Plan Note (Signed)
 Currently on methimazole .   Endo appt in November.

## 2024-07-30 NOTE — Assessment & Plan Note (Signed)
 Follow-up cardiology.  Status post ablation recently.  Doing fairly well at this time.

## 2024-07-30 NOTE — Assessment & Plan Note (Addendum)
 BP is fairly well controlled.  Continue current antihypertensive medications.

## 2024-07-30 NOTE — Telephone Encounter (Unsigned)
 Copied from CRM #[id number]. Topic: Clinical - Medication Refill >> Jul 30, 2024  3:00 PM Shanda MATSU wrote: Medication: methimazole  (TAPAZOLE ) 5 MG tablet   Has the patient contacted their pharmacy? Yes (Agent: If no, request that the patient contact the pharmacy for the refill. If patient does not wish to contact the pharmacy document the reason why and proceed with request.) (Agent: If yes, when and what did the pharmacy advise?)  This is the patient's preferred pharmacy:  [id number] - [place], [place] - [address] [place] [address] Phone: [phone] Fax: [phone]  Is this the correct pharmacy for this prescription? Yes If no, delete pharmacy and type the correct one.   Has the prescription been filled recently? No  Is the patient out of the medication? Yes  Has the patient been seen for an appointment in the last year OR does the patient have an upcoming appointment? Yes  Can we respond through MyChart? Yes  Agent: Please be advised that Rx refills may take up to 3 business days. We ask that you follow-up with your pharmacy.

## 2024-07-31 MED ORDER — METHIMAZOLE 5 MG PO TABS: 5.0000 mg | ORAL_TABLET | Freq: Three times a day (TID) | ORAL | 0 refills | Status: DC

## 2024-08-06 ENCOUNTER — Encounter: Payer: Self-pay | Admitting: Endocrinology

## 2024-08-06 ENCOUNTER — Other Ambulatory Visit

## 2024-08-06 ENCOUNTER — Telehealth: Payer: Self-pay

## 2024-08-06 ENCOUNTER — Ambulatory Visit (INDEPENDENT_AMBULATORY_CARE_PROVIDER_SITE_OTHER): Payer: Self-pay | Admitting: Endocrinology

## 2024-08-06 VITALS — BP 136/80 | HR 95 | Resp 16 | Ht 71.0 in | Wt 191.6 lb

## 2024-08-06 DIAGNOSIS — E0590 Thyrotoxicosis, unspecified without thyrotoxic crisis or storm: Secondary | ICD-10-CM | POA: Diagnosis not present

## 2024-08-06 DIAGNOSIS — E0500 Thyrotoxicosis with diffuse goiter without thyrotoxic crisis or storm: Secondary | ICD-10-CM | POA: Diagnosis not present

## 2024-08-06 NOTE — Telephone Encounter (Signed)
 Copied from CRM #[id number]. Topic: Clinical - Medical Advice >> Aug 06, 2024 11:54 AM Kevelyn M wrote: Reason for CRM: patient's job is requesting a letter/note specifications of what he can or can't do at work following his heart surgery. For example a weight limit. Please advise.  Call back #[phone]
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
## 2024-08-07 ENCOUNTER — Telehealth: Payer: Self-pay | Admitting: Cardiovascular Disease

## 2024-08-07 NOTE — Telephone Encounter (Signed)
 Pt is requesting a callback regarding him needing a note with restrictions for work in order for him to go back. Pt stated he has procedure done 2 weeks ago. He'd like to discuss further with a nurse. Please advise

## 2024-08-07 NOTE — Telephone Encounter (Signed)
 Spoke with pt and advised pt may return to normal activities after 7 days.  Pt states he will need a note to return to work.  Pt advised will contact once note has been written and signed.  Pt verbalizes understanding and agrees with current plan.

## 2024-08-08 LAB — T3, FREE: T3, Free: 4.2 pg/mL (ref 2.3–4.2)

## 2024-08-08 LAB — T4, FREE: Free T4: 1.4 ng/dL (ref 0.8–1.8)

## 2024-08-08 LAB — TSH: TSH: 0.01 m[IU]/L — ABNORMAL LOW (ref 0.40–4.50)

## 2024-08-08 LAB — TRAB (TSH RECEPTOR BINDING ANTIBODY): TRAB: 5.44 IU/L — ABNORMAL HIGH (ref <=2.00)

## 2024-08-09 ENCOUNTER — Telehealth: Payer: Self-pay | Admitting: Family Medicine

## 2024-08-09 NOTE — Telephone Encounter (Signed)
 Copied from CRM #[id number]. Topic: General - Other >> Aug 09, 2024 10:42 AM Olam RAMAN wrote: Reason for CRM: Pt calling for restriciion paper from pcp and surgeon is saying pcp is the one who has to right note CB [phone] PT NEEDS RESTRICTIONS NOTE IN DETAILED WHAT HE CAN OR CAN NOT DO AT WORK SINCE HEART SURGERY.

## 2024-08-10 ENCOUNTER — Encounter: Payer: Self-pay | Admitting: Endocrinology

## 2024-08-10 ENCOUNTER — Ambulatory Visit: Payer: Self-pay | Admitting: Endocrinology

## 2024-08-10 MED ORDER — METHIMAZOLE 5 MG PO TABS: 10.0000 mg | ORAL_TABLET | Freq: Every day | ORAL | 3 refills | Status: AC

## 2024-08-14 NOTE — Telephone Encounter (Signed)
Letter completed and sent through MyChart 

## 2024-08-20 ENCOUNTER — Ambulatory Visit (HOSPITAL_COMMUNITY)
Admission: RE | Admit: 2024-08-20 | Discharge: 2024-08-20 | Disposition: A | Discharge status: Home / Self Care | Source: Ambulatory Visit | Attending: Physician Assistant | Admitting: Physician Assistant

## 2024-08-20 VITALS — BP 128/80 | HR 85 | Ht 71.0 in | Wt 190.6 lb

## 2024-08-20 DIAGNOSIS — I4891 Unspecified atrial fibrillation: Secondary | ICD-10-CM | POA: Diagnosis not present

## 2024-08-20 DIAGNOSIS — D6869 Other thrombophilia: Secondary | ICD-10-CM | POA: Diagnosis not present

## 2024-08-20 DIAGNOSIS — I4819 Other persistent atrial fibrillation: Secondary | ICD-10-CM | POA: Diagnosis not present
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
## 2024-09-12 ENCOUNTER — Other Ambulatory Visit: Payer: Self-pay | Admitting: Family Medicine

## 2024-09-12 DIAGNOSIS — E119 Type 2 diabetes mellitus without complications: Secondary | ICD-10-CM

## 2024-09-13 ENCOUNTER — Ambulatory Visit: Admitting: Endocrinology

## 2024-10-05 ENCOUNTER — Other Ambulatory Visit

## 2024-10-12 ENCOUNTER — Ambulatory Visit: Admitting: Endocrinology

## 2024-10-23 ENCOUNTER — Ambulatory Visit: Admitting: Cardiovascular Disease

## 2024-10-23 NOTE — Progress Notes (Deleted)
" °  Cardiology Office Note:  .   Date:  10/23/2024  ID:  Oneil Rummer, DOB May 29, 1966, MRN [id number] PCP: Alvia Bring, DO  [facility] Providers Cardiologist:  Alean SAUNDERS Madireddy, MD Electrophysiologist:  Eulas FORBES Furbish, MD {  History of Present Illness: Roger   Carmichael Fisher is a 58 y.o. male w/PMHx of  HTN, HLD, DM AFib  He saw Dr. Furbish July 2025 for management recommendation for his AFib, symptomatic when in AFib and recurrent despite flecainide  > pt had self stopped Planned for ablation  Noted that his TSH was low > advised w/u   Subsequently started on methimazole  (following w/Dr. Mercie, endo)  AFib ablation 07/23/24  Saw endo 08/06/24, TSH 0.01 free T3 4.3, and T4 1.4 thyroid -levels have improved and normalized free T4 and free T3  >> no med changes made  Saw the AFib clinic 08/20/24, reported one episode of palpitations lasting several hours. No procedural concerns No changes made  Today's visit is scheduled as his 90 day post ablation visit ROS:   *** xarelto , dose, bleeding *** Afib symptoms   Arrhythmia/AAD hx AFib found ~ 2019 Flecainide  stopped with recurrent AFib  Studies Reviewed: Roger    EKG done today and reviewed by myself:  ***  TTE Mar 28, 2024 LVEF 60 to 65%.  Normal right ventricular size.  Normal mitral valve structure.   7 day monitor August 2019 No atrial fibrillation detected though there were occasional APCs   Risk Assessment/Calculations:    Physical Exam:   VS:  There were no vitals taken for this visit.   Wt Readings from Last 3 Encounters:  08/20/24 190 lb 9.6 oz (86.5 kg)  08/06/24 191 lb 9.6 oz (86.9 kg)  07/27/24 196 lb (88.9 kg)    GEN: Well nourished, well developed in no acute distress NECK: No JVD; No carotid bruits CARDIAC: ***RRR, no murmurs, rubs, gallops RESPIRATORY:  *** CTA b/l without rales, wheezing or rhonchi  ABDOMEN: Soft, non-tender, non-distended EXTREMITIES: *** No edema; No deformity    ASSESSMENT AND PLAN: .    persistent AFib CHA2DS2Vasc is 2, on Xarelto , *** appropriately dosde *** burden post ablation by symptoms   Dispo: ***  Signed, Charlies Macario Arthur, PA-C   "

## 2024-10-26 ENCOUNTER — Ambulatory Visit: Admitting: Physician Assistant

## 2024-11-03 ENCOUNTER — Other Ambulatory Visit: Payer: Self-pay | Admitting: Family Medicine

## 2024-11-03 DIAGNOSIS — N529 Male erectile dysfunction, unspecified: Secondary | ICD-10-CM

## 2024-11-15 ENCOUNTER — Other Ambulatory Visit

## 2024-11-16 ENCOUNTER — Other Ambulatory Visit: Payer: Self-pay | Admitting: Family Medicine

## 2024-11-16 LAB — TSH: TSH: 0.04 m[IU]/L — ABNORMAL LOW (ref 0.40–4.50)

## 2024-11-16 LAB — T3, FREE: T3, Free: 3.3 pg/mL (ref 2.3–4.2)

## 2024-11-16 LAB — T4, FREE: Free T4: 1.2 ng/dL (ref 0.8–1.8)

## 2024-11-22 ENCOUNTER — Ambulatory Visit (INDEPENDENT_AMBULATORY_CARE_PROVIDER_SITE_OTHER): Admitting: Endocrinology

## 2024-11-22 ENCOUNTER — Encounter: Payer: Self-pay | Admitting: Endocrinology

## 2024-11-22 VITALS — BP 150/80 | HR 83 | Resp 16 | Ht 71.0 in | Wt 200.2 lb

## 2024-11-22 DIAGNOSIS — E0590 Thyrotoxicosis, unspecified without thyrotoxic crisis or storm: Secondary | ICD-10-CM | POA: Diagnosis not present

## 2024-11-22 DIAGNOSIS — E0500 Thyrotoxicosis with diffuse goiter without thyrotoxic crisis or storm: Secondary | ICD-10-CM
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
## 2024-11-26 ENCOUNTER — Ambulatory Visit: Admitting: Student

## 2024-12-03 ENCOUNTER — Other Ambulatory Visit: Payer: Self-pay | Admitting: Family Medicine

## 2024-12-03 DIAGNOSIS — E119 Type 2 diabetes mellitus without complications: Secondary | ICD-10-CM

## 2024-12-28 ENCOUNTER — Ambulatory Visit: Admitting: Physician Assistant

## 2025-02-22 ENCOUNTER — Other Ambulatory Visit

## 2025-03-01 ENCOUNTER — Ambulatory Visit: Admitting: Endocrinology
